# Patient Record
Sex: Female | Born: 1995 | Race: Black or African American | Hispanic: No | Marital: Single | Smoking: Never smoker
Health system: Southern US, Community
[De-identification: ages and names within clinical notes are randomized; demographics above are authoritative.]

## PROBLEM LIST (undated history)

## (undated) DIAGNOSIS — K219 Gastro-esophageal reflux disease without esophagitis: Secondary | ICD-10-CM

## (undated) DIAGNOSIS — N62 Hypertrophy of breast: Secondary | ICD-10-CM

## (undated) DIAGNOSIS — G4733 Obstructive sleep apnea (adult) (pediatric): Secondary | ICD-10-CM

## (undated) DIAGNOSIS — G43909 Migraine, unspecified, not intractable, without status migrainosus: Secondary | ICD-10-CM

## (undated) HISTORY — DX: Morbid (severe) obesity due to excess calories: E66.01

## (undated) HISTORY — PX: UPPER GASTROINTESTINAL ENDOSCOPY: SHX188

## (undated) HISTORY — DX: Obstructive sleep apnea (adult) (pediatric): G47.33

---

## 2017-11-19 ENCOUNTER — Other Ambulatory Visit: Payer: Self-pay | Admitting: Student

## 2017-11-19 DIAGNOSIS — Z8719 Personal history of other diseases of the digestive system: Secondary | ICD-10-CM

## 2017-11-19 DIAGNOSIS — R1011 Right upper quadrant pain: Secondary | ICD-10-CM

## 2017-11-19 DIAGNOSIS — R1013 Epigastric pain: Secondary | ICD-10-CM

## 2017-11-23 ENCOUNTER — Other Ambulatory Visit: Payer: Self-pay

## 2017-11-27 ENCOUNTER — Other Ambulatory Visit: Payer: Self-pay

## 2017-12-26 ENCOUNTER — Other Ambulatory Visit: Payer: Self-pay

## 2017-12-26 ENCOUNTER — Encounter (HOSPITAL_BASED_OUTPATIENT_CLINIC_OR_DEPARTMENT_OTHER): Payer: Self-pay | Admitting: Emergency Medicine

## 2017-12-26 ENCOUNTER — Emergency Department (HOSPITAL_BASED_OUTPATIENT_CLINIC_OR_DEPARTMENT_OTHER)
Admission: EM | Admit: 2017-12-26 | Discharge: 2017-12-26 | Disposition: A | Discharge status: Home / Self Care | Payer: 59 | Attending: Emergency Medicine | Admitting: Emergency Medicine

## 2017-12-26 DIAGNOSIS — J111 Influenza due to unidentified influenza virus with other respiratory manifestations: Secondary | ICD-10-CM | POA: Insufficient documentation

## 2017-12-26 DIAGNOSIS — R509 Fever, unspecified: Secondary | ICD-10-CM | POA: Diagnosis present

## 2017-12-26 DIAGNOSIS — R69 Illness, unspecified: Secondary | ICD-10-CM

## 2017-12-26 MED ORDER — ACETAMINOPHEN 325 MG PO TABS
650.0000 mg | ORAL_TABLET | Freq: Once | ORAL | Status: AC | PRN
  Administered 2017-12-26: 650 mg via ORAL
  Filled 2017-12-26: qty 2

## 2017-12-26 NOTE — Discharge Instructions (Signed)
Take Tylenol every 4 hours for aches or for temperature higher than 100.4.Make sure that you drink at least six 8 ounce glasses of water or Gatorade each day in order to stay well-hydrated.  Call the number on these instructions to get a primary care physician or see an urgent care center or the doctor of your choice if not feeling better in a week.  Return if concern for any reason

## 2017-12-26 NOTE — ED Provider Notes (Signed)
[facility] EMERGENCY DEPARTMENT Provider Note   CSN: [id number] Arrival date & time: 12/26/17  1143     History   Chief Complaint Chief Complaint  Patient presents with  . Flu like symptoms    HPI Kelsey Rojas is a 22 y.o. female.  Complains of headache sore throat cough fever and diffuse myalgias onset 3 days ago.  Denies any shortness of breath.  No nausea or vomiting.  No other associated symptoms.  Treated with naproxen, without relief.  Nothing makes symptoms better or worse.  No other associated symptoms  HPI  History reviewed. No pertinent past medical history.  There are no active problems to display for this patient.   History reviewed. No pertinent surgical history.  OB History    No data available       Home Medications    Prior to Admission medications   Not on File    Family History No family history on file.  Social History Social History   Tobacco Use  . Smoking status: Never Smoker  . Smokeless tobacco: Never Used  Substance Use Topics  . Alcohol use: Yes    Frequency: Never  . Drug use: No     Allergies   Amoxicillin and Penicillins   Review of Systems Review of Systems  Constitutional: Positive for fever.  HENT: Positive for sore throat.   Respiratory: Positive for cough.   Cardiovascular: Negative.   Gastrointestinal: Negative.   Musculoskeletal: Positive for myalgias.  Skin: Negative.   Neurological: Positive for headaches.  Psychiatric/Behavioral: Negative.   All other systems reviewed and are negative.    Physical Exam Updated Vital Signs BP 139/79 (BP Location: Left Arm)   Pulse (!) 103   Temp (!) 100.9 F (38.3 C) (Oral)   Resp 18   Ht 5\' 2"  (1.575 m)   Wt 119.7 kg (264 lb)   LMP 12/18/2017   SpO2 95%   BMI 48.29 kg/m   Physical Exam  Constitutional: She is oriented to person, place, and time. She appears well-developed and well-nourished. No distress.  HENT:  Head: Normocephalic and  atraumatic.  Right Ear: External ear normal.  Left Ear: External ear normal.  Mouth/Throat: Oropharynx is clear and moist.  Bilateral tympanic membranes normal  Eyes: Conjunctivae are normal. Pupils are equal, round, and reactive to light.  Neck: Neck supple. No tracheal deviation present. No thyromegaly present.  Cardiovascular: Normal rate and regular rhythm.  No murmur heard. Pulmonary/Chest: Effort normal and breath sounds normal.  Abdominal: Soft. Bowel sounds are normal. She exhibits no distension. There is no tenderness.  Obese  Musculoskeletal: Normal range of motion. She exhibits no edema or tenderness.  Lymphadenopathy:    She has no cervical adenopathy.  Neurological: She is alert and oriented to person, place, and time. Coordination normal.  Skin: Skin is warm and dry. Capillary refill takes less than 2 seconds. No rash noted.  Psychiatric: She has a normal mood and affect.  Nursing note and vitals reviewed.    ED Treatments / Results  Labs (all labs ordered are listed, but only abnormal results are displayed) Labs Reviewed - No data to display  EKG  EKG Interpretation None       Radiology No results found.  Procedures Procedures (including critical care time)  Medications Ordered in ED Medications  acetaminophen (TYLENOL) tablet 650 mg (650 mg Oral Given 12/26/17 1211)     Initial Impression / Assessment and Plan / ED Course  I have reviewed  the triage vital signs and the nursing notes.  Pertinent labs & imaging results that were available during my care of the patient were reviewed by me and considered in my medical decision making (see chart for details).     1 PM feels improved after treatment with Tylenol No further diagnostic workup needed.  Plan Tylenol.  Encourage oral hydration.  Referral primary care. Final Clinical Impressions(s) / ED Diagnoses  Diagnosis influenza-like illness Final diagnoses:  None    ED Discharge Orders    None         Doug SouJacubowitz, Bekah Igoe, MD 12/26/17 1306

## 2017-12-26 NOTE — ED Triage Notes (Signed)
Pt with cough, headache, body aches and subjective fever x 3 days.

## 2018-04-18 ENCOUNTER — Emergency Department (HOSPITAL_BASED_OUTPATIENT_CLINIC_OR_DEPARTMENT_OTHER)
Admission: EM | Admit: 2018-04-18 | Discharge: 2018-04-18 | Disposition: A | Discharge status: Home / Self Care | Payer: 59 | Attending: Emergency Medicine | Admitting: Emergency Medicine

## 2018-04-18 ENCOUNTER — Encounter (HOSPITAL_BASED_OUTPATIENT_CLINIC_OR_DEPARTMENT_OTHER): Payer: Self-pay | Admitting: Emergency Medicine

## 2018-04-18 ENCOUNTER — Other Ambulatory Visit: Payer: Self-pay

## 2018-04-18 DIAGNOSIS — N898 Other specified noninflammatory disorders of vagina: Secondary | ICD-10-CM | POA: Insufficient documentation

## 2018-04-18 DIAGNOSIS — N949 Unspecified condition associated with female genital organs and menstrual cycle: Secondary | ICD-10-CM

## 2018-04-18 LAB — WET PREP, GENITAL
CLUE CELLS WET PREP: NONE SEEN
SPERM: NONE SEEN
Trich, Wet Prep: NONE SEEN
Yeast Wet Prep HPF POC: NONE SEEN

## 2018-04-18 LAB — URINALYSIS, ROUTINE W REFLEX MICROSCOPIC
BILIRUBIN URINE: NEGATIVE
Glucose, UA: NEGATIVE mg/dL
Hgb urine dipstick: NEGATIVE
KETONES UR: NEGATIVE mg/dL
Leukocytes, UA: NEGATIVE
NITRITE: NEGATIVE
Protein, ur: NEGATIVE mg/dL
Specific Gravity, Urine: 1.02 (ref 1.005–1.030)
pH: 7.5 (ref 5.0–8.0)

## 2018-04-18 LAB — PREGNANCY, URINE: Preg Test, Ur: NEGATIVE

## 2018-04-18 NOTE — ED Triage Notes (Signed)
Pt states it "feels like I have air pockets in my vagina". Denies discharge.

## 2018-04-18 NOTE — Discharge Instructions (Signed)
Today you were seen and evaluated for abnormal vaginal sensation.  Today you have been tested for gonorrhea and chlamydia.  The test to determine if you have these will take a few days. They will only call you if your tests come back positive, no news is good news. In the result that your tests are positive you will need treatment.  Please do not have physical relations for the next 1-2 weeks.

## 2018-04-18 NOTE — ED Provider Notes (Signed)
[facility] EMERGENCY DEPARTMENT Provider Note   CSN: [id number] Arrival date & time: 04/18/18  1856     History   Chief Complaint Chief Complaint  Patient presents with  . vaginal problem    HPI Malachi Prolyiah Rhines is a 22 y.o. female who presents today for evaluation of feeling like she has air pockets in her vagina.  She reports that this is been present for approximately 1 week, and is unchanged.  She reports that she is sexually active, however has not had intercourse in the past 3 weeks.  She denies any abnormal vaginal drainage or discharge.  No vaginal odors.  Reports that she has never had anything like this before.  She denies pain, only stating that it feels like she has a bubble near the opening of her vagina that will not come out.  She denies air escaping from her vagina.    HPI  History reviewed. No pertinent past medical history.  There are no active problems to display for this patient.   History reviewed. No pertinent surgical history.   OB History   None      Home Medications    Prior to Admission medications   Not on File    Family History No family history on file.  Social History Social History   Tobacco Use  . Smoking status: Never Smoker  . Smokeless tobacco: Never Used  Substance Use Topics  . Alcohol use: Yes    Frequency: Never  . Drug use: No     Allergies   Amoxicillin and Penicillins   Review of Systems Review of Systems  Constitutional: Negative for chills and fever.  HENT: Negative for ear pain and sore throat.   Eyes: Negative for pain and visual disturbance.  Respiratory: Negative for cough and shortness of breath.   Cardiovascular: Negative for chest pain and palpitations.  Gastrointestinal: Negative for abdominal pain and vomiting.  Genitourinary: Negative for dysuria, hematuria, vaginal bleeding, vaginal discharge and vaginal pain.  Musculoskeletal: Negative for arthralgias and back pain.  Skin: Negative for  color change and rash.  Neurological: Negative for seizures and syncope.  All other systems reviewed and are negative.    Physical Exam Updated Vital Signs BP 132/74 (BP Location: Right Arm)   Pulse 80   Temp 98.1 F (36.7 C) (Oral)   Resp 14   Ht 5\' 2"  (1.575 m)   Wt 126.6 kg (279 lb)   LMP 02/26/2018   SpO2 100%   BMI 51.03 kg/m   Physical Exam  Constitutional: She appears well-developed and well-nourished. No distress.  HENT:  Head: Normocephalic and atraumatic.  Eyes: Conjunctivae are normal. Right eye exhibits no discharge. Left eye exhibits no discharge. No scleral icterus.  Neck: Normal range of motion.  Cardiovascular: Normal rate and regular rhythm.  Pulmonary/Chest: Effort normal. No stridor. No respiratory distress.  Abdominal: She exhibits no distension.  Genitourinary:  Genitourinary Comments: Exam performed with chaperone in room.  Normal external female genitalia.  Vaginal canal without obvious foreign body, no drainage.  Bimanual exam without masses or focal tenderness.  No abnormal air noted.  Musculoskeletal: She exhibits no edema or deformity.  Neurological: She is alert. She exhibits normal muscle tone.  Skin: Skin is warm and dry. She is not diaphoretic.  Psychiatric: She has a normal mood and affect. Her behavior is normal.  Nursing note and vitals reviewed.    ED Treatments / Results  Labs (all labs ordered are listed, but only abnormal  results are displayed) Labs Reviewed  WET PREP, GENITAL - Abnormal; Notable for the following components:      Result Value   WBC, Wet Prep HPF POC FEW (*)    All other components within normal limits  URINALYSIS, ROUTINE W REFLEX MICROSCOPIC  PREGNANCY, URINE  GC/CHLAMYDIA PROBE AMP ([place]) NOT AT [facility]    EKG None  Radiology No results found.  Procedures Procedures (including critical care time)  Medications Ordered in ED Medications - No data to display   Initial Impression / Assessment  and Plan / ED Course  I have reviewed the triage vital signs and the nursing notes.  Pertinent labs & imaging results that were available during my care of the patient were reviewed by me and considered in my medical decision making (see chart for details).    Patient is a 22 year old woman who presents today for evaluation of feeling like she has air bubbles trapped in her vagina.  Discussed evaluation options with patient who elected for pelvic exam.  Pelvic exam unremarkable, no adnexal tenderness or fullness, no cervical motion tenderness.  Wet prep with occasional white blood cells.  Gonorrhea chlamydia swabs sent.  No obvious cause for patient's symptoms found.  Patient was not pregnant, urine was unremarkable.  Primary care follow-up recommended.  Patient given information for women's outpatient hospital clinic.  Patient discharged home.  Final Clinical Impressions(s) / ED Diagnoses   Final diagnoses:  Vaginal discomfort    ED Discharge Orders    None       Cristina Gong, PA-C 04/19/18 0255    Rolan Bucco, MD 04/26/18 1745

## 2018-04-18 NOTE — ED Notes (Signed)
Pt discharged to home NAD.  

## 2018-04-20 LAB — GC/CHLAMYDIA PROBE AMP (~~LOC~~) NOT AT ARMC
Chlamydia: POSITIVE — AB
Neisseria Gonorrhea: NEGATIVE

## 2018-04-21 ENCOUNTER — Telehealth: Payer: Self-pay | Admitting: Student

## 2018-04-21 DIAGNOSIS — A749 Chlamydial infection, unspecified: Secondary | ICD-10-CM

## 2018-04-21 MED ORDER — AZITHROMYCIN 500 MG PO TABS: 1000.0000 mg | ORAL_TABLET | Freq: Once | ORAL | 0 refills | Status: AC

## 2018-04-21 NOTE — Telephone Encounter (Addendum)
Celester Coate tested positive for  Chlamydia. Patient was called by RN and allergies and pharmacy confirmed. Rx sent to pharmacy of choice.   Judeth HornLawrence, Danie Hannig, NP 04/21/2018 2:43 PM       ----- Message from Kathe BectonLori S Berdik, RN sent at 04/21/2018  2:08 PM EDT ----- This patient tested positive for :  chlamydia  She ,"is allergic to Penicillins and Amoxicillin",  I have informed the patient of her results and confirmed her pharmacy is correct in her chart. Please send Rx.   Thank you,   Kathe BectonBerdik, Lori S, RN   [facility]

## 2018-09-15 ENCOUNTER — Ambulatory Visit: Payer: Self-pay | Admitting: Plastic Surgery

## 2018-09-17 DIAGNOSIS — N62 Hypertrophy of breast: Secondary | ICD-10-CM

## 2018-09-17 HISTORY — DX: Hypertrophy of breast: N62

## 2018-10-04 ENCOUNTER — Encounter (HOSPITAL_BASED_OUTPATIENT_CLINIC_OR_DEPARTMENT_OTHER): Payer: Self-pay | Admitting: *Deleted

## 2018-10-04 NOTE — Pre-Procedure Instructions (Signed)
Discussed BMI of 50.3 with Dr. Rondall AllegraS. Turk - pt. will be better served having surgery [facility].  Elizabeth at Dr. Sherald Hessontogiannis' office notified.

## 2018-10-05 NOTE — Pre-Procedure Instructions (Signed)
[facility].  Instructed to wait for notification from Dr. Sherald Hessontogiannis' office.

## 2018-10-08 ENCOUNTER — Ambulatory Visit: Payer: Self-pay | Admitting: Plastic Surgery

## 2018-10-11 ENCOUNTER — Encounter (HOSPITAL_COMMUNITY): Payer: Self-pay | Admitting: *Deleted

## 2018-10-11 ENCOUNTER — Other Ambulatory Visit: Payer: Self-pay

## 2018-10-11 NOTE — Progress Notes (Signed)
Spoke with pt for pre-op call. Pt denies cardiac history, diabetes or HTN.

## 2018-10-11 NOTE — Anesthesia Preprocedure Evaluation (Addendum)
Anesthesia Evaluation  Patient identified by MRN, date of birth, ID band Patient awake    Reviewed: Allergy & Precautions, NPO status , Patient's Chart, lab work & pertinent test results  Airway Mallampati: I  TM Distance: >3 FB Neck ROM: Full    Dental  (+) Teeth Intact, Dental Advisory Given   Pulmonary neg pulmonary ROS,    Pulmonary exam normal breath sounds clear to auscultation       Cardiovascular negative cardio ROS Normal cardiovascular exam Rhythm:Regular Rate:Normal     Neuro/Psych  Headaches, negative psych ROS   GI/Hepatic Neg liver ROS, GERD  Medicated and Controlled,  Endo/Other  Morbid obesityBilateral macromastia  Renal/GU negative Renal ROS  negative genitourinary   Musculoskeletal negative musculoskeletal ROS (+)   Abdominal (+) + obese,   Peds  Hematology negative hematology ROS (+)   Anesthesia Other Findings   Reproductive/Obstetrics                           Anesthesia Physical Anesthesia Plan  ASA: III  Anesthesia Plan: General   Post-op Pain Management:    Induction: Intravenous  PONV Risk Score and Plan: 4 or greater and Scopolamine patch - Pre-op, Ondansetron, Dexamethasone, Midazolam and Treatment may vary due to age or medical condition  Airway Management Planned: Oral ETT  Additional Equipment: None  Intra-op Plan:   Post-operative Plan: Extubation in OR  Informed Consent: I have reviewed the patients History and Physical, chart, labs and discussed the procedure including the risks, benefits and alternatives for the proposed anesthesia with the patient or authorized representative who has indicated his/her understanding and acceptance.   Dental advisory given  Plan Discussed with: CRNA, Anesthesiologist and Surgeon  Anesthesia Plan Comments:        Anesthesia Quick Evaluation

## 2018-10-12 ENCOUNTER — Encounter (HOSPITAL_COMMUNITY)
Admission: RE | Disposition: A | Discharge status: Home / Self Care | Payer: Self-pay | Source: Ambulatory Visit | Attending: Plastic Surgery

## 2018-10-12 ENCOUNTER — Ambulatory Visit (HOSPITAL_COMMUNITY)
Admission: RE | Admit: 2018-10-12 | Discharge: 2018-10-12 | Disposition: A | Discharge status: Home / Self Care | Payer: 59 | Source: Ambulatory Visit | Attending: Plastic Surgery | Admitting: Plastic Surgery

## 2018-10-12 ENCOUNTER — Ambulatory Visit (HOSPITAL_COMMUNITY): Payer: 59 | Admitting: Certified Registered"

## 2018-10-12 ENCOUNTER — Encounter (HOSPITAL_COMMUNITY): Payer: Self-pay | Admitting: *Deleted

## 2018-10-12 DIAGNOSIS — N62 Hypertrophy of breast: Secondary | ICD-10-CM | POA: Insufficient documentation

## 2018-10-12 DIAGNOSIS — K219 Gastro-esophageal reflux disease without esophagitis: Secondary | ICD-10-CM | POA: Diagnosis not present

## 2018-10-12 DIAGNOSIS — Z79899 Other long term (current) drug therapy: Secondary | ICD-10-CM | POA: Diagnosis not present

## 2018-10-12 HISTORY — DX: Hypertrophy of breast: N62

## 2018-10-12 HISTORY — PX: BREAST REDUCTION SURGERY: SHX8

## 2018-10-12 HISTORY — DX: Migraine, unspecified, not intractable, without status migrainosus: G43.909

## 2018-10-12 HISTORY — DX: Gastro-esophageal reflux disease without esophagitis: K21.9

## 2018-10-12 LAB — CBC
HEMATOCRIT: 42.4 % (ref 36.0–46.0)
HEMOGLOBIN: 12.5 g/dL (ref 12.0–15.0)
MCH: 26.8 pg (ref 26.0–34.0)
MCHC: 29.5 g/dL — ABNORMAL LOW (ref 30.0–36.0)
MCV: 91 fL (ref 80.0–100.0)
NRBC: 0 % (ref 0.0–0.2)
Platelets: 224 10*3/uL (ref 150–400)
RBC: 4.66 MIL/uL (ref 3.87–5.11)
RDW: 12.7 % (ref 11.5–15.5)
WBC: 5.8 10*3/uL (ref 4.0–10.5)

## 2018-10-12 LAB — POCT PREGNANCY, URINE: Preg Test, Ur: NEGATIVE

## 2018-10-12 SURGERY — MAMMOPLASTY, REDUCTION
Anesthesia: General | Site: Breast | Laterality: Bilateral

## 2018-10-12 MED ORDER — CHLORHEXIDINE GLUCONATE CLOTH 2 % EX PADS: 6.0000 | MEDICATED_PAD | Freq: Once | CUTANEOUS | Status: DC

## 2018-10-12 MED ORDER — BUPIVACAINE-EPINEPHRINE (PF) 0.25% -1:200000 IJ SOLN
INTRAMUSCULAR | Status: AC
  Filled 2018-10-12: qty 30

## 2018-10-12 MED ORDER — CEFAZOLIN SODIUM 1 G IJ SOLR
INTRAMUSCULAR | Status: AC
  Filled 2018-10-12: qty 30

## 2018-10-12 MED ORDER — DEXAMETHASONE SODIUM PHOSPHATE 10 MG/ML IJ SOLN
INTRAMUSCULAR | Status: DC | PRN
  Administered 2018-10-12: 10 mg via INTRAVENOUS

## 2018-10-12 MED ORDER — HYDROCODONE-ACETAMINOPHEN 7.5-325 MG PO TABS
1.0000 | ORAL_TABLET | Freq: Once | ORAL | Status: AC | PRN
  Administered 2018-10-12: 1 via ORAL

## 2018-10-12 MED ORDER — ALBUMIN HUMAN 5 % IV SOLN
INTRAVENOUS | Status: DC | PRN
  Administered 2018-10-12: 09:00:00 via INTRAVENOUS

## 2018-10-12 MED ORDER — PROPOFOL 10 MG/ML IV BOLUS
INTRAVENOUS | Status: AC
  Filled 2018-10-12: qty 20

## 2018-10-12 MED ORDER — BACITRACIN ZINC 500 UNIT/GM EX OINT
TOPICAL_OINTMENT | CUTANEOUS | Status: AC
  Filled 2018-10-12: qty 28.35

## 2018-10-12 MED ORDER — MIDAZOLAM HCL 2 MG/2ML IJ SOLN
INTRAMUSCULAR | Status: AC
  Filled 2018-10-12: qty 2

## 2018-10-12 MED ORDER — METOCLOPRAMIDE HCL 5 MG/ML IJ SOLN: 10.0000 mg | Freq: Once | INTRAMUSCULAR | Status: DC | PRN

## 2018-10-12 MED ORDER — LIDOCAINE-EPINEPHRINE 1 %-1:100000 IJ SOLN
INTRAMUSCULAR | Status: AC
  Filled 2018-10-12: qty 3

## 2018-10-12 MED ORDER — SCOPOLAMINE 1 MG/3DAYS TD PT72
MEDICATED_PATCH | TRANSDERMAL | Status: AC
  Filled 2018-10-12: qty 2

## 2018-10-12 MED ORDER — DEXAMETHASONE SODIUM PHOSPHATE 10 MG/ML IJ SOLN
INTRAMUSCULAR | Status: AC
  Filled 2018-10-12: qty 1

## 2018-10-12 MED ORDER — SCOPOLAMINE 1 MG/3DAYS TD PT72
MEDICATED_PATCH | TRANSDERMAL | Status: DC | PRN
  Administered 2018-10-12: 1 via TRANSDERMAL

## 2018-10-12 MED ORDER — SODIUM CHLORIDE (PF) 0.9 % IJ SOLN
INTRAMUSCULAR | Status: AC
  Filled 2018-10-12: qty 10

## 2018-10-12 MED ORDER — 0.9 % SODIUM CHLORIDE (POUR BTL) OPTIME
TOPICAL | Status: DC | PRN
  Administered 2018-10-12 (×3): 1000 mL

## 2018-10-12 MED ORDER — LACTATED RINGERS IV SOLN
INTRAVENOUS | Status: DC | PRN
  Administered 2018-10-12 (×3): via INTRAVENOUS

## 2018-10-12 MED ORDER — SUFENTANIL CITRATE 50 MCG/ML IV SOLN
INTRAVENOUS | Status: AC
  Filled 2018-10-12: qty 1

## 2018-10-12 MED ORDER — SODIUM CHLORIDE (PF) 0.9 % IJ SOLN
INTRAMUSCULAR | Status: DC | PRN
  Administered 2018-10-12: 40 mL
  Administered 2018-10-12: 80 mL

## 2018-10-12 MED ORDER — ONDANSETRON HCL 4 MG/2ML IJ SOLN
INTRAMUSCULAR | Status: AC
  Filled 2018-10-12: qty 2

## 2018-10-12 MED ORDER — PROPOFOL 10 MG/ML IV BOLUS
INTRAVENOUS | Status: DC | PRN
  Administered 2018-10-12: 200 mg via INTRAVENOUS

## 2018-10-12 MED ORDER — BUPIVACAINE-EPINEPHRINE (PF) 0.25% -1:200000 IJ SOLN
INTRAMUSCULAR | Status: DC | PRN
  Administered 2018-10-12: 60 mL

## 2018-10-12 MED ORDER — HYDROMORPHONE HCL 1 MG/ML IJ SOLN
0.2500 mg | INTRAMUSCULAR | Status: DC | PRN
  Administered 2018-10-12: 0.5 mg via INTRAVENOUS

## 2018-10-12 MED ORDER — ROCURONIUM BROMIDE 50 MG/5ML IV SOSY
PREFILLED_SYRINGE | INTRAVENOUS | Status: AC
  Filled 2018-10-12: qty 5

## 2018-10-12 MED ORDER — MEPERIDINE HCL 50 MG/ML IJ SOLN: 6.2500 mg | INTRAMUSCULAR | Status: DC | PRN

## 2018-10-12 MED ORDER — DEXTROSE 5 % IV SOLN
3.0000 g | INTRAVENOUS | Status: AC
  Administered 2018-10-12 (×2): 3 g via INTRAVENOUS
  Filled 2018-10-12: qty 3

## 2018-10-12 MED ORDER — ONDANSETRON HCL 4 MG/2ML IJ SOLN
INTRAMUSCULAR | Status: DC | PRN
  Administered 2018-10-12: 4 mg via INTRAVENOUS

## 2018-10-12 MED ORDER — LIDOCAINE 2% (20 MG/ML) 5 ML SYRINGE
INTRAMUSCULAR | Status: DC | PRN
  Administered 2018-10-12: 100 mg via INTRAVENOUS
  Administered 2018-10-12: 50 mg via INTRAVENOUS

## 2018-10-12 MED ORDER — HYDROMORPHONE HCL 1 MG/ML IJ SOLN
INTRAMUSCULAR | Status: AC
  Filled 2018-10-12: qty 1

## 2018-10-12 MED ORDER — CEFAZOLIN SODIUM-DEXTROSE 2-4 GM/100ML-% IV SOLN: 2.0000 g | INTRAVENOUS | Status: DC

## 2018-10-12 MED ORDER — BUPIVACAINE-EPINEPHRINE (PF) 0.25% -1:200000 IJ SOLN
INTRAMUSCULAR | Status: AC
  Filled 2018-10-12: qty 60

## 2018-10-12 MED ORDER — LIDOCAINE-EPINEPHRINE 1 %-1:100000 IJ SOLN
INTRAMUSCULAR | Status: DC | PRN
  Administered 2018-10-12: 40 mL

## 2018-10-12 MED ORDER — ROCURONIUM BROMIDE 10 MG/ML (PF) SYRINGE
PREFILLED_SYRINGE | INTRAVENOUS | Status: DC | PRN
  Administered 2018-10-12: 50 mg via INTRAVENOUS

## 2018-10-12 MED ORDER — HYDROCODONE-ACETAMINOPHEN 7.5-325 MG PO TABS
ORAL_TABLET | ORAL | Status: AC
  Filled 2018-10-12: qty 1

## 2018-10-12 MED ORDER — MIDAZOLAM HCL 5 MG/5ML IJ SOLN
INTRAMUSCULAR | Status: DC | PRN
  Administered 2018-10-12: 2 mg via INTRAVENOUS

## 2018-10-12 MED ORDER — BUPIVACAINE LIPOSOME 1.3 % IJ SUSP
20.0000 mL | INTRAMUSCULAR | Status: AC
  Administered 2018-10-12: 20 mL
  Filled 2018-10-12: qty 20

## 2018-10-12 MED ORDER — LIDOCAINE 2% (20 MG/ML) 5 ML SYRINGE
INTRAMUSCULAR | Status: AC
  Filled 2018-10-12: qty 5

## 2018-10-12 MED ORDER — SUFENTANIL CITRATE 50 MCG/ML IV SOLN
INTRAVENOUS | Status: DC | PRN
  Administered 2018-10-12: 5 ug via INTRAVENOUS
  Administered 2018-10-12 (×4): 10 ug via INTRAVENOUS
  Administered 2018-10-12: 20 ug via INTRAVENOUS
  Administered 2018-10-12: 5 ug via INTRAVENOUS

## 2018-10-12 SURGICAL SUPPLY — 62 items
ALCOHOL ISOPROPYL (RUBBING) (MISCELLANEOUS) ×1 IMPLANT
ATCH SMKEVC FLXB CAUT HNDSWH (FILTER) ×1 IMPLANT
BLADE KNIFE  10 PERSONNA (BLADE) ×5 IMPLANT
BLADE KNIFE PERSONA 15 (BLADE) ×4 IMPLANT
BLADE SURG 10 STRL SS (BLADE) ×1 IMPLANT
BLADE SURG 15 STRL LF DISP TIS (BLADE) IMPLANT
BLADE SURG 15 STRL SS (BLADE) ×2
BNDG GAUZE ELAST 4 BULKY (GAUZE/BANDAGES/DRESSINGS) ×4 IMPLANT
CANISTER SUCT 3000ML PPV (MISCELLANEOUS) ×2 IMPLANT
CONT SPEC 4OZ CLIKSEAL STRL BL (MISCELLANEOUS) ×1 IMPLANT
COVER WAND RF STERILE (DRAPES) ×2 IMPLANT
DRAIN CHANNEL 10F 3/8 F FF (DRAIN) ×5 IMPLANT
DRAPE HALF SHEET 40X57 (DRAPES) ×4 IMPLANT
DRAPE ORTHO SPLIT 77X108 STRL (DRAPES) ×2
DRAPE SURG ORHT 6 SPLT 77X108 (DRAPES) ×2 IMPLANT
DRSG EMULSION OIL 3X3 NADH (GAUZE/BANDAGES/DRESSINGS) ×2 IMPLANT
DRSG PAD ABDOMINAL 8X10 ST (GAUZE/BANDAGES/DRESSINGS) ×4 IMPLANT
DRSG TELFA 3X8 NADH (GAUZE/BANDAGES/DRESSINGS) IMPLANT
ELECT CAUTERY BLADE 6.4 (BLADE) ×2 IMPLANT
ELECT REM PT RETURN 9FT ADLT (ELECTROSURGICAL) ×2
ELECTRODE REM PT RTRN 9FT ADLT (ELECTROSURGICAL) ×1 IMPLANT
EVACUATOR SILICONE 100CC (DRAIN) ×5 IMPLANT
EVACUATOR SMOKE ACCUVAC VALLEY (FILTER) ×1
GAUZE SPONGE 4X4 12PLY STRL (GAUZE/BANDAGES/DRESSINGS) ×2 IMPLANT
GLOVE BIO SURGEON STRL SZ 6.5 (GLOVE) ×4 IMPLANT
GLOVE BIO SURGEON STRL SZ7 (GLOVE) ×1 IMPLANT
GLOVE BIO SURGEON STRL SZ7.5 (GLOVE) ×3 IMPLANT
GLOVE BIOGEL PI IND STRL 7.5 (GLOVE) IMPLANT
GLOVE BIOGEL PI INDICATOR 7.5 (GLOVE) ×2
GOWN STRL REUS W/ TWL LRG LVL3 (GOWN DISPOSABLE) ×2 IMPLANT
GOWN STRL REUS W/TWL LRG LVL3 (GOWN DISPOSABLE) ×2
KIT BASIN OR (CUSTOM PROCEDURE TRAY) ×2 IMPLANT
KIT TURNOVER KIT B (KITS) ×2 IMPLANT
MARKER SKIN DUAL TIP RULER LAB (MISCELLANEOUS) ×2 IMPLANT
NDL HYPO 25GX1X1/2 BEV (NEEDLE) ×1 IMPLANT
NDL SPNL 18GX3.5 QUINCKE PK (NEEDLE) ×1 IMPLANT
NEEDLE HYPO 25GX1X1/2 BEV (NEEDLE) ×2 IMPLANT
NEEDLE SPNL 18GX3.5 QUINCKE PK (NEEDLE) ×2 IMPLANT
PACK GENERAL/GYN (CUSTOM PROCEDURE TRAY) ×2 IMPLANT
PAD ARMBOARD 7.5X6 YLW CONV (MISCELLANEOUS) ×4 IMPLANT
PAD DRESSING TELFA 3X8 NADH (GAUZE/BANDAGES/DRESSINGS) IMPLANT
PAD SHARPS MAGNETIC DISPOSAL (MISCELLANEOUS) ×2 IMPLANT
PIN SAFETY STERILE (MISCELLANEOUS) ×2 IMPLANT
PREFILTER EVAC NS 1 1/3-3/8IN (MISCELLANEOUS) ×2 IMPLANT
PREFILTER SMOKE EVAC (FILTER) ×2 IMPLANT
SPECIMEN JAR LARGE (MISCELLANEOUS) ×2 IMPLANT
SPONGE LAP 18X18 RF (DISPOSABLE) ×1 IMPLANT
SPONGE LAP 18X18 X RAY DECT (DISPOSABLE) IMPLANT
STAPLER VISISTAT 35W (STAPLE) ×4 IMPLANT
STRIP CLOSURE SKIN 1/2X4 (GAUZE/BANDAGES/DRESSINGS) ×4 IMPLANT
SUT ETHILON 3 0 PS 1 (SUTURE) ×2 IMPLANT
SUT MNCRL AB 3-0 PS2 18 (SUTURE) ×10 IMPLANT
SUT MNCRL AB 4-0 PS2 18 (SUTURE) ×8 IMPLANT
SUT MON AB 5-0 PS2 18 (SUTURE) ×6 IMPLANT
SUT PROLENE 2 0 CT2 30 (SUTURE) ×2 IMPLANT
SUT PROLENE 3 0 PS 1 (SUTURE) ×2 IMPLANT
SUT VLOC 180 0 24IN GS25 (SUTURE) ×1 IMPLANT
SUT VLOC 90 P-14 23 (SUTURE) ×3 IMPLANT
SYR BULB IRRIGATION 50ML (SYRINGE) ×2 IMPLANT
SYR CONTROL 10ML LL (SYRINGE) ×4 IMPLANT
TRAY FOLEY CATH SILVER 16FR (SET/KITS/TRAYS/PACK) ×2 IMPLANT
TUBE CONNECTING 12X1/4 (SUCTIONS) ×4 IMPLANT

## 2018-10-12 NOTE — H&P (Signed)
  H&P faxed to surgical center.  -History and Physical Reviewed  -Patient has been re-examined  -No change in the plan of care  CONTOGIANNIS,MARY A    

## 2018-10-12 NOTE — Brief Op Note (Signed)
10/12/2018  12:55 PM  PATIENT:  Kelsey Rojas  22 y.o. female  PRE-OPERATIVE DIAGNOSIS:  BILATERAL MACROMASTIA  POST-OPERATIVE DIAGNOSIS:  BILATERAL MACROMASTIA  PROCEDURE:  Procedure(s) with comments: BILATERAL MAMMARY REDUCTION  (BREAST) (Bilateral) - BILATERAL MAMMARY REDUCTION  (BREAST)  SURGEON:  Surgeon(s) and Role:    * Contogiannis, Chales AbrahamsMary Ann, MD - Primary  ANESTHESIA:   general  EBL:  125 mL   BLOOD ADMINISTERED:none  DRAINS: (62F) Jackson-Pratt drain(s) with closed bulb suction in the Bilateral Breasts   LOCAL MEDICATIONS USED:  1.3% Exparel (total 266 mgs.)  SPECIMEN:  Source of Specimen:  Bilateral Breasts  DISPOSITION OF SPECIMEN:  PATHOLOGY  COUNTS:  YES  DICTATION: .Note written in EPIC  PLAN OF CARE: Discharge to home after PACU  PATIENT DISPOSITION:  PACU - hemodynamically stable.   Delay start of Pharmacological VTE agent (>24hrs) due to surgical blood loss or risk of bleeding: not applicable

## 2018-10-12 NOTE — Discharge Instructions (Signed)
1. No lifting greater than 5 lbs with arms for 4 weeks. 2. Empty, strip, record and reactivate JP drains 3 times a day. 3. Percocet 5/325 mg tabs 1-2 tabs po q 4-6 hours prn pain- prescription given in office. 4. Duricef 1 tab po bid- prescription given in office. 5. Sterapred dose pack as directed- prescription given in office. 6. Follow-up appointment Friday in office.   

## 2018-10-12 NOTE — Anesthesia Postprocedure Evaluation (Signed)
Anesthesia Post Note  Patient: Kelsey Rojas  Procedure(s) Performed: BILATERAL MAMMARY REDUCTION  (BREAST) (Bilateral Breast)     Patient location during evaluation: PACU Anesthesia Type: General Level of consciousness: awake and alert and oriented Pain management: pain level controlled Vital Signs Assessment: post-procedure vital signs reviewed and stable Respiratory status: spontaneous breathing, nonlabored ventilation and respiratory function stable Cardiovascular status: blood pressure returned to baseline and stable Postop Assessment: no apparent nausea or vomiting Anesthetic complications: no    Last Vitals:  Vitals:   10/12/18 1330 10/12/18 1345  BP: (!) 148/90 (!) 148/98  Pulse: (!) 108 85  Resp: 20 15  Temp:    SpO2: 100% 100%    Last Pain:  Vitals:   10/12/18 1345  TempSrc:   PainSc: 0-No pain                 [name]

## 2018-10-12 NOTE — Anesthesia Procedure Notes (Signed)
Procedure Name: Intubation Date/Time: 10/12/2018 7:47 AM Performed by: Moshe Salisbury, CRNA Pre-anesthesia Checklist: Patient identified, Emergency Drugs available, Suction available and Patient being monitored Patient Re-evaluated:Patient Re-evaluated prior to induction Oxygen Delivery Method: Circle System Utilized Preoxygenation: Pre-oxygenation with 100% oxygen Induction Type: IV induction Ventilation: Mask ventilation without difficulty Laryngoscope Size: Mac and 3 Grade View: Grade I Tube type: Oral Tube size: 7.5 mm Number of attempts: 1 Airway Equipment and Method: Stylet and Oral airway Placement Confirmation: ETT inserted through vocal cords under direct vision,  positive ETCO2 and breath sounds checked- equal and bilateral Secured at: 21 cm Tube secured with: Tape Dental Injury: Teeth and Oropharynx as per pre-operative assessment

## 2018-10-12 NOTE — Op Note (Signed)
OPERATIVE REPORT  10/12/2018  Kelsey Rojas  PREOPERATIVE DIAGNOSIS:  Bilateral Macromastia.  POSTOPERATIVE DIAGNOSIS:  Bilateral Macromastia.  PROCEDURE:  Bilateral Reduction Mammoplasties.  ATTENDING SURGEON:  Eloise Levels, MD  ANESTHESIA:  General.  ANESTHESIOLOGIST: M. Malen Gauze, MD  COMPLICATIONS:  None.  INDICATIONS FOR THE PROCEDURE:  The patient is a 22 y.o. female who has bilateral macromastia that is clinically symptomatic.  She presents to undergo bilateral reduction mammoplasties.  DESCRIPTION OF PROCEDURE:  The patient was marked in preop holding area in a pattern of Wise for the future bilateral reduction mammoplasties. She was then taken back to the OR, placed on the table in supine position.  After adequate general anesthesia was obtained, the patient's chest was prepped with Techni-Care and draped in sterile fashion.  The bases of the breasts have been infiltrated with 1% lidocaine with epinephrine.  After adequate hemostasis and anesthesia taken effect, the procedure was begun.  Both of the breast reductions were performed in the following similar manner.  The nipple-areolar complex was marked with a 45-mm nipple marker.  The skin was then incised and deepithelialized around the nipple-areolar complex down to the inframammary crease in the inferior pedicle pattern.  Next, the medial, superior, and lateral skin flaps were elevated down to the chest wall.  Excess fat and glandular tissue removed from the inferior pedicle.  The nipple-areolar complex was examined and found to be pink and viable.  The wound was irrigated with saline irrigation.  Meticulous hemostasis was obtained with the Bovie electrocautery.  Inferior pedicle was centralized using 3-0 Prolene suture.  A #10 JP flat fully fluted drain was placed into the wound. The skin flaps were brought together at the inverted T junction with a 2- 0 Prolene suture.  The incisions were stapled for  temporary closure. The breasts compared and found to have good shape and symmetry.  The incisions were then closed from the medial aspect of the JP drain to the medial aspect of the [facility] incision by first placing a few 3-0 Monocryl sutures to tack together the dermal layer, and then both the dermal and cuticular layer were closed in a single layer using a 3-0 V-lock PDO barbed suture.  Lateral to the JP drain incision was closed using 3-0 Monocryl in the dermal layer, followed by 3-0 Monocryl running intracuticular stitch on the skin.  The vertical limb of the Wise pattern was closed in the dermal layer using 3-0 Monocryl suture.  The patient was placed in the upright position.  The future location of the nipple-areolar complexes was marked on both breast mounds using the 45-mm nipple marker.  She was then placed back in the recumbent position.  Both of the nipple areolar complexes were brought out onto the breast mounds in the following similar manner.  The skin was incised as marked and removed in full thickness into the subcutaneous tissues.  The nipple- areolar complex was examined, found to be pink and viable, then brought out through this aperture and sewn in place using 4-0 Monocryl in the dermal layer, followed by 5-0 Monocryl running intracuticular stitch on the skin.  This 5-0 Monocryl suture was then brought down to close the cuticular layer of the vertical limb as well.  The JP drain was sewn in place using 3-0 nylon suture.  The pectoralis major muscle and fascia along with the breast and chest soft tissues were then infiltrated with 1% Exparel (total 266 mg).  Now the [facility] incision was also infiltrated with  the Exparel in order to give the patient postoperative pain control.  The incisions were dressed with benzoin, Steri-Strips, and the nipples dressed with bacitracin ointment and Adaptic.  4x4s were placed over the incisions and ABD pads in the axillary areas.  The patient  was placed into a light postoperative support bra.  There were no complications. The patient tolerated the procedure well.  The final needle, sponge counts were reported to be correct at the end of the case.  The patient was then recovered without complications.  Both the patient and her family were given proper postoperative wound care instructions. She was then discharged home in the care of her family in stable condition.  Follow up will be with me in a few days in the office.         Eloise LevelsMary Ann Blakelynn Rojas, M.D.  10/12/2018 1:00 PM

## 2018-10-12 NOTE — Transfer of Care (Signed)
Immediate Anesthesia Transfer of Care Note  Patient: Kelsey Rojas  Procedure(s) Performed: BILATERAL MAMMARY REDUCTION  (BREAST) (Bilateral Breast)  Patient Location: PACU  Anesthesia Type:General  Level of Consciousness: patient cooperative and responds to stimulation  Airway & Oxygen Therapy: Patient Spontanous Breathing and Patient connected to nasal cannula oxygen  Post-op Assessment: Report given to RN and Post -op Vital signs reviewed and stable  Post vital signs: Reviewed and stable  Last Vitals:  Vitals Value Taken Time  BP 152/91 10/12/2018  1:13 PM  Temp    Pulse 102 10/12/2018  1:15 PM  Resp 24 10/12/2018  1:15 PM  SpO2 95 % 10/12/2018  1:15 PM  Vitals shown include unvalidated device data.  Last Pain:  Vitals:   10/12/18 0648  TempSrc:   PainSc: 0-No pain      Patients Stated Pain Goal: 6 (10/12/18 [id number])  Complications: No apparent anesthesia complications

## 2018-10-13 ENCOUNTER — Encounter (HOSPITAL_COMMUNITY): Payer: Self-pay | Admitting: Plastic Surgery

## 2019-03-14 ENCOUNTER — Emergency Department (HOSPITAL_BASED_OUTPATIENT_CLINIC_OR_DEPARTMENT_OTHER)
Admission: EM | Admit: 2019-03-14 | Discharge: 2019-03-14 | Disposition: A | Discharge status: Home / Self Care | Payer: 59 | Attending: Emergency Medicine | Admitting: Emergency Medicine

## 2019-03-14 ENCOUNTER — Other Ambulatory Visit: Payer: Self-pay

## 2019-03-14 ENCOUNTER — Encounter (HOSPITAL_BASED_OUTPATIENT_CLINIC_OR_DEPARTMENT_OTHER): Payer: Self-pay | Admitting: *Deleted

## 2019-03-14 DIAGNOSIS — M545 Low back pain, unspecified: Secondary | ICD-10-CM

## 2019-03-14 LAB — URINALYSIS, ROUTINE W REFLEX MICROSCOPIC
Bilirubin Urine: NEGATIVE
Glucose, UA: NEGATIVE mg/dL
Hgb urine dipstick: NEGATIVE
Ketones, ur: NEGATIVE mg/dL
Leukocytes,Ua: NEGATIVE
Nitrite: NEGATIVE
Protein, ur: NEGATIVE mg/dL
Specific Gravity, Urine: >1.03 — ABNORMAL HIGH (ref 1.005–1.030)
pH: 6.5 (ref 5.0–8.0)

## 2019-03-14 LAB — PREGNANCY, URINE: Preg Test, Ur: NEGATIVE

## 2019-03-14 NOTE — ED Provider Notes (Signed)
[facility] EMERGENCY DEPARTMENT Provider Note   CSN: [id number] Arrival date & time: 03/14/19  2145    History   Chief Complaint Chief Complaint  Patient presents with   Back Pain    HPI Kelsey Rojas is a 23 y.o. female with PMH/o GERD, Macromastia, Migraines who presents for evaluation of 1 week of lower back pain.  She states that pain radiates to her sides.  She reports pain is worse with movement and walking.  She denies any preceding trauma, injury, fall.  Patient reports she took 1 dose of muscle relaxers and continued to have pain.  She has not taken any other medications.  Patient states that the pain does not radiate into the abdomen, pelvis or vagina.  Patient states she has not had any nausea/vomiting, dysuria, hematuria, urinary frequency.  She has not noticed any vaginal bleeding or vaginal discharge.  Patient states she is also had some itching in her bilateral ears.  Denies any fevers. Denies fevers, weight loss, numbness/weakness of upper and lower extremities, bowel/bladder incontinence, saddle anesthesia, history of back surgery, history of IVDA.      The history is provided by the patient.    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   Past Surgical History:  Procedure Laterality Date   BREAST REDUCTION SURGERY Bilateral 10/12/2018   Procedure: BILATERAL MAMMARY REDUCTION  (BREAST);  Surgeon: Contogiannis, Chales Abrahams, MD;  Location: [facility] OR;  Service: Plastics;  Laterality: Bilateral;  BILATERAL MAMMARY REDUCTION  (BREAST)   UPPER GASTROINTESTINAL ENDOSCOPY       OB History   No obstetric history on file.      Home Medications    Prior to Admission medications   Medication Sig Start Date End Date Taking? Authorizing Provider  pantoprazole (PROTONIX) 40 MG tablet Take 40 mg by mouth daily.    [provider]    Family History History reviewed. No pertinent family history.  Social History Social History   Tobacco Use   Smoking status: Never Smoker   Smokeless tobacco: Never Used  Substance Use Topics   Alcohol use: Yes    Frequency: Never    Comment: occasionally   Drug use: No     Allergies   Ciprofloxacin and Penicillins   Review of Systems Review of Systems  Constitutional: Negative for fever.  HENT: Positive for ear pain.   Respiratory: Negative for cough and shortness of breath.   Cardiovascular: Negative for chest pain.  Gastrointestinal: Negative for abdominal pain, nausea and vomiting.  Genitourinary: Negative for dysuria and hematuria.  Musculoskeletal: Positive for back pain.  Neurological: Negative for weakness and numbness.  All other systems reviewed and are negative.    Physical Exam Updated Vital Signs BP (!) 147/87    Pulse 94    Temp 98.4 F (36.9 C) (Oral)    Resp 18    Ht  (1.6 m)    Wt 129.3 kg    LMP 03/13/2018    SpO2 98%    BMI 50.49 kg/m   Physical Exam Vitals signs and nursing note reviewed.  Constitutional:      Appearance: Normal appearance. She is well-developed.  HENT:     Head: Normocephalic and atraumatic.     Right Ear: Tympanic membrane normal.     Left Ear: Tympanic membrane normal.  Eyes:     General: Lids are normal.     Conjunctiva/sclera: Conjunctivae normal.     Pupils: Pupils are equal, round, and reactive to light.  Neck:     Musculoskeletal: Full passive range of motion without pain.  Cardiovascular:     Rate and Rhythm: Normal rate and regular rhythm.     Pulses: Normal pulses.     Heart sounds: Normal heart sounds. No murmur. No friction rub. No gallop.   Pulmonary:     Effort: Pulmonary effort is normal.  Abdominal:     Palpations: Abdomen is soft. Abdomen is not rigid.     Tenderness: There is no abdominal tenderness. There is no guarding. Negative signs include McBurney's sign.      Comments: Abdomen is soft, non-distended, non-tender. No rigidity, No guarding. No peritoneal signs. No tenderness noted at McBurney's point.   Musculoskeletal: Normal range of motion.     Thoracic back: She exhibits no tenderness.       Back:     Comments: No midline tenderness to palpation noted to the midline thoracic. Diffuse muscular tenderness noted to the lower lumbar region that extends over midline. No deformity or crepitus.   Skin:    General: Skin is warm and dry.     Capillary Refill: Capillary refill takes less than 2 seconds.  Neurological:     Mental Status: She is alert and oriented to person, place, and time.     Comments: Follows commands, Moves all extremities  5/5 strength to BUE and BLE  Sensation intact throughout all major nerve distributions  Psychiatric:        Speech: Speech normal.      ED Treatments / Results  Labs (all labs ordered are listed, but only abnormal results are displayed) Labs Reviewed  URINALYSIS, ROUTINE W REFLEX MICROSCOPIC - Abnormal; Notable for the following components:      Result Value   APPearance CLOUDY (*)    Specific Gravity, Urine >1.030 (*)    All other components within normal limits  PREGNANCY, URINE    EKG None  Radiology No results found.  Procedures Procedures (including critical care time)  Medications Ordered in ED Medications - No data to display   Initial Impression / Assessment and Plan / ED Course  I have reviewed the triage vital signs and the nursing notes.  Pertinent labs & imaging results that were available during my care of the patient were reviewed by me and considered in my medical decision making (see chart for details).        23 y.o. F with PMH/o GERD, Macromastia, Migraines who presents for evaluation of lower back pain x1 week. No preceding trauma, injury or fall. Patient reports she took one dose of muscle relaxer with no improvement.  She has not taken any other medications.  Patient  states he goes to her sides but does not radiate into the abdomen or pelvis.  Patient denies any vaginal discharge, bleeding.  She has not had any dysuria, hematuria. Patient is afebrile, non-toxic appearing, sitting comfortably on examination table. Vital signs reviewed and stable.  No neuro deficits noted on exam.  Patient with diffuse muscular tenderness into the lower lumbar region.  No deformity or crepitus noted.  Normal neuro exam.  Patient with no abdominal tenderness.  Doubt intra-abdominal process.  Suspect musculoskeletal pain.  We will plan to check UA, urine pregnancy.  Patient with no history of trauma, injury.  No indication for imaging at this  time. History/Physical exam not concerning for cauda equina, spinal abscess. Doubt PID.   Urine pregnancy negative.  UA negative for any infectious etiology.  Discussed results with patient.  Discussed with her that this most likely musculoskeletal etiology.  Patient only took one-time dose of muscle relaxers.  Encouraged continued anti-inflammatory and I will defer of care measures. At this time, patient exhibits no emergent life-threatening condition that require further evaluation in ED or admission. Patient had ample opportunity for questions and discussion. All patient's questions were answered with full understanding. Strict return precautions discussed. Patient expresses understanding and agreement to plan.   Portions of this note were generated with Scientist, clinical (histocompatibility and immunogenetics)Dragon dictation software. Dictation errors may occur despite best attempts at proofreading.    Final Clinical Impressions(s) / ED Diagnoses   Final diagnoses:  Acute bilateral low back pain, unspecified whether sciatica present    ED Discharge Orders    None       Maxwell CaulLayden, Shaquasha Gerstel A, PA-C 03/15/19 0007    Rolan BuccoBelfi, Melanie, MD 03/15/19 1457

## 2019-03-14 NOTE — ED Notes (Signed)
Pt understood dc material. NAD noted. All questions given at dc. Pt escorted to checkout window.

## 2019-03-14 NOTE — ED Triage Notes (Signed)
Pt c/o lower back pain which radiates around to pelvis  x 1 week

## 2019-03-14 NOTE — ED Notes (Signed)
ED Provider at bedside. 

## 2019-03-14 NOTE — Discharge Instructions (Signed)
You can take Tylenol or Ibuprofen as directed for pain. You can alternate Tylenol and Ibuprofen every 4 hours. If you take Tylenol at 1pm, then you can take Ibuprofen at 5pm. Then you can take Tylenol again at 9pm.   Take the muscle relaxers as directed.  Apply heat to help with pain.  Return to the Emergency Department immediately for any worsening back pain, neck pain, difficulty walking, numbness/weaknss of your arms or legs, urinary or bowel accidents, fever, nausea/vomiting, abdominal pain or any other worsening concerning symptoms or any other worsening or concerning symptoms.

## 2020-01-27 ENCOUNTER — Emergency Department (HOSPITAL_BASED_OUTPATIENT_CLINIC_OR_DEPARTMENT_OTHER)
Admission: EM | Admit: 2020-01-27 | Discharge: 2020-01-27 | Disposition: A | Discharge status: Home / Self Care | Payer: HRSA Program | Attending: Emergency Medicine | Admitting: Emergency Medicine

## 2020-01-27 ENCOUNTER — Emergency Department (HOSPITAL_BASED_OUTPATIENT_CLINIC_OR_DEPARTMENT_OTHER): Payer: HRSA Program

## 2020-01-27 ENCOUNTER — Encounter (HOSPITAL_BASED_OUTPATIENT_CLINIC_OR_DEPARTMENT_OTHER): Payer: Self-pay | Admitting: Emergency Medicine

## 2020-01-27 ENCOUNTER — Other Ambulatory Visit: Payer: Self-pay

## 2020-01-27 DIAGNOSIS — R519 Headache, unspecified: Secondary | ICD-10-CM | POA: Diagnosis present

## 2020-01-27 DIAGNOSIS — Z79899 Other long term (current) drug therapy: Secondary | ICD-10-CM | POA: Diagnosis not present

## 2020-01-27 DIAGNOSIS — U071 COVID-19: Secondary | ICD-10-CM | POA: Insufficient documentation

## 2020-01-27 LAB — CBC WITH DIFFERENTIAL/PLATELET
Abs Immature Granulocytes: 0.01 10*3/uL (ref 0.00–0.07)
Basophils Absolute: 0 10*3/uL (ref 0.0–0.1)
Basophils Relative: 0 %
Eosinophils Absolute: 0 10*3/uL (ref 0.0–0.5)
Eosinophils Relative: 0 %
HCT: 44.1 % (ref 36.0–46.0)
Hemoglobin: 13.5 g/dL (ref 12.0–15.0)
Immature Granulocytes: 0 %
Lymphocytes Relative: 49 %
Lymphs Abs: 1.9 10*3/uL (ref 0.7–4.0)
MCH: 27.4 pg (ref 26.0–34.0)
MCHC: 30.6 g/dL (ref 30.0–36.0)
MCV: 89.5 fL (ref 80.0–100.0)
Monocytes Absolute: 0.5 10*3/uL (ref 0.1–1.0)
Monocytes Relative: 11 %
Neutro Abs: 1.6 10*3/uL — ABNORMAL LOW (ref 1.7–7.7)
Neutrophils Relative %: 40 %
Platelets: 185 10*3/uL (ref 150–400)
RBC: 4.93 MIL/uL (ref 3.87–5.11)
RDW: 13.1 % (ref 11.5–15.5)
WBC: 4 10*3/uL (ref 4.0–10.5)
nRBC: 0 % (ref 0.0–0.2)

## 2020-01-27 LAB — COMPREHENSIVE METABOLIC PANEL
ALT: 35 U/L (ref 0–44)
AST: 33 U/L (ref 15–41)
Albumin: 4 g/dL (ref 3.5–5.0)
Alkaline Phosphatase: 62 U/L (ref 38–126)
Anion gap: 7 (ref 5–15)
BUN: 8 mg/dL (ref 6–20)
CO2: 26 mmol/L (ref 22–32)
Calcium: 8.4 mg/dL — ABNORMAL LOW (ref 8.9–10.3)
Chloride: 105 mmol/L (ref 98–111)
Creatinine, Ser: 0.93 mg/dL (ref 0.44–1.00)
GFR calc Af Amer: >60 mL/min (ref >60)
GFR calc non Af Amer: >60 mL/min (ref >60)
Glucose, Bld: 104 mg/dL — ABNORMAL HIGH (ref 70–99)
Potassium: 3.8 mmol/L (ref 3.5–5.1)
Sodium: 138 mmol/L (ref 135–145)
Total Bilirubin: 0.5 mg/dL (ref 0.3–1.2)
Total Protein: 7.3 g/dL (ref 6.5–8.1)

## 2020-01-27 LAB — SARS CORONAVIRUS 2 AG (30 MIN TAT): SARS Coronavirus 2 Ag: POSITIVE — AB

## 2020-01-27 MED ORDER — METOCLOPRAMIDE HCL 5 MG/ML IJ SOLN
10.0000 mg | Freq: Once | INTRAMUSCULAR | Status: AC
  Administered 2020-01-27: 10 mg via INTRAVENOUS
  Filled 2020-01-27: qty 2

## 2020-01-27 MED ORDER — SODIUM CHLORIDE 0.9 % IV BOLUS
1000.0000 mL | Freq: Once | INTRAVENOUS | Status: AC
  Administered 2020-01-27: 10:00:00 1000 mL via INTRAVENOUS

## 2020-01-27 MED ORDER — DIPHENHYDRAMINE HCL 50 MG/ML IJ SOLN
25.0000 mg | Freq: Once | INTRAMUSCULAR | Status: AC
  Administered 2020-01-27: 10:00:00 25 mg via INTRAVENOUS
  Filled 2020-01-27: qty 1

## 2020-01-27 MED ORDER — ACETAMINOPHEN 325 MG PO TABS
650.0000 mg | ORAL_TABLET | Freq: Once | ORAL | Status: AC
  Administered 2020-01-27: 650 mg via ORAL
  Filled 2020-01-27: qty 2

## 2020-01-27 NOTE — ED Provider Notes (Signed)
[facility] EMERGENCY DEPARTMENT Provider Note   CSN: [id number] Arrival date & time: 01/27/20  9323     History Chief Complaint  Patient presents with  . Headache    Kelsey Rojas is a 24 y.o. female.   Headache Associated symptoms: cough, fever and nausea   Associated symptoms: no abdominal pain, no neck pain and no vomiting       24 year old female, no significant medical history, presents with headache and fever.  Patient states she has a frontal headache for the last 5 days.  She describes it as a sharp pain between the eyes.  She was seen and evaluated for this at urgent care on Monday and told she tested negative for COVID-19 and the flu.  She was also told she did not have a urinary tract infection.  She has been managing her symptoms with Tylenol with minimal improvement.  She notes some associated nausea and a mild cough.  She denies any abdominal pain, vomiting, neck pain or stiffness, rashes.  He denies any chest pain or shortness of breath.     Past Medical History:  Diagnosis Date  . GERD (gastroesophageal reflux disease)   . 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 Migraines     There are no problems to display for this patient.   Past Surgical History:  Procedure Laterality Date  . BREAST REDUCTION SURGERY Bilateral 10/12/2018   Procedure: BILATERAL MAMMARY REDUCTION  (BREAST);  Surgeon: Contogiannis, Audrea Muscat, MD;  Location: [facility];  Service: Plastics;  Laterality: Bilateral;  BILATERAL MAMMARY REDUCTION  (BREAST)  . UPPER GASTROINTESTINAL ENDOSCOPY       OB History   No obstetric history on file.     No family history on file.  Social History   Tobacco Use  . Smoking status: Never Smoker  . Smokeless tobacco: Never Used  Substance Use Topics  . Alcohol use: Yes    Comment: occasionally  . Drug use: No    Home Medications Prior to Admission medications   Medication Sig Start Date End Date Taking? Authorizing Provider  nitrofurantoin,  macrocrystal-monohydrate, (MACROBID) 100 MG capsule Take 100 mg by mouth 2 (two) times daily. 01/25/20   [provider]  omeprazole (PRILOSEC) 20 MG capsule Take 20 mg by mouth daily. 11/29/19   [provider]    Allergies    Penicillins, Ciprofloxacin, and Amoxicillin  Review of Systems   Review of Systems  Constitutional: Positive for chills and fever.  Respiratory: Positive for cough. Negative for shortness of breath.   Cardiovascular: Negative for chest pain.  Gastrointestinal: Positive for nausea. Negative for abdominal pain and vomiting.  Genitourinary: Negative for dysuria and urgency.  Musculoskeletal: Negative for neck pain.  Neurological: Positive for headaches.    Physical Exam Updated Vital Signs BP 134/86   Pulse (!) 106   Temp 100.3 F (37.9 C) (Oral)   LMP 12/16/2019   SpO2 99%   Physical Exam Vitals and nursing note reviewed.  Constitutional:      Appearance: She is well-developed.  HENT:     Head: Normocephalic and atraumatic.  Eyes:     Conjunctiva/sclera: Conjunctivae normal.  Cardiovascular:     Rate and Rhythm: Regular rhythm. Tachycardia present.     Heart sounds: Normal heart sounds. No murmur.  Pulmonary:     Effort: Pulmonary effort is normal. No respiratory distress.     Breath sounds: Normal breath sounds. No wheezing or rales.  Abdominal:     General: Bowel sounds are  normal. There is no distension.     Palpations: Abdomen is soft.     Tenderness: There is no abdominal tenderness.  Musculoskeletal:        General: No tenderness or deformity. Normal range of motion.     Cervical back: Neck supple.  Skin:    General: Skin is warm and dry.     Findings: No erythema or rash.  Neurological:     General: No focal deficit present.     Mental Status: She is alert and oriented to person, place, and time.     Cranial Nerves: Cranial nerves are intact.     Sensory: Sensation is intact.     Motor: Motor function is intact.      Coordination: Coordination is intact.  Psychiatric:        Behavior: Behavior normal.     ED Results / Procedures / Treatments   Labs (all labs ordered are listed, but only abnormal results are displayed) Labs Reviewed  SARS CORONAVIRUS 2 AG (30 MIN TAT)  CBC WITH DIFFERENTIAL/PLATELET  COMPREHENSIVE METABOLIC PANEL  URINALYSIS, ROUTINE W REFLEX MICROSCOPIC  PREGNANCY, URINE    EKG None  Radiology No results found.  Procedures Procedures (including critical care time)  Medications Ordered in ED Medications  acetaminophen (TYLENOL) tablet 650 mg (has no administration in time range)  metoCLOPramide (REGLAN) injection 10 mg (has no administration in time range)  diphenhydrAMINE (BENADRYL) injection 25 mg (has no administration in time range)  sodium chloride 0.9 % bolus 1,000 mL (has no administration in time range)    ED Course  I have reviewed the triage vital signs and the nursing notes.  Pertinent labs & imaging results that were available during my care of the patient were reviewed by me and considered in my medical decision making (see chart for details).    MDM Rules/Calculators/A&P                       Patient presents with complaints of headache, fever, cough, nausea.  Patient well-appearing, no acute distress, nontoxic, non-lethargic.  Patient with a temp of 100.3, slightly tachycardic.  Neuro exam nonfocal, nonlateralizing.  Lungs clear to auscultation throughout.  No increased work of breathing or accessory muscle use.  Chest x-ray shows no evidence of pneumonia, pneumothorax, pleural effusion.  CBC and CMP unremarkable.  Patient unable to give a urine specimen however she does state that it was checked in urgent care this week and within normal limits.  Patient's COVID-19 antigen swab is positive.  This is likely the cause of patient's symptoms.  On reevaluation she continues to be very well-appearing, no acute distress.  She states her headache has resolved  completely.  Will discharge with instructions for quarantine and return precautions.  Patient agreeable with plan, ready and stable for discharge.    At this time there does not appear to be any evidence of an acute emergency medical condition and the patient appears stable for discharge with appropriate outpatient follow up.Diagnosis was discussed with patient who verbalizes understanding and is agreeable to discharge.     Kelsey Rojas was evaluated in Emergency Department on 01/27/2020 for the symptoms described in the history of present illness. She was evaluated in the context of the global COVID-19 pandemic, which necessitated consideration that the patient might be at risk for infection with the SARS-CoV-2 virus that causes COVID-19. Institutional protocols and algorithms that pertain to the evaluation of patients at risk for COVID-19 are  in a state of rapid change based on information released by regulatory bodies including the CDC and federal and state organizations. These policies and algorithms were followed during the patient's care in the ED.    Final Clinical Impression(s) / ED Diagnoses Final diagnoses:  None    Rx / DC Orders ED Discharge Orders    None       Rueben Bash 01/27/20 1731    Pollyann Savoy, MD 01/28/20 [phone]

## 2020-01-27 NOTE — ED Triage Notes (Addendum)
Pt has been having a headache above her eyes since Monday.  Low grade fever at home.  Denies any drainage at this point.  Pt seen at urgent care this week for same, was given abx for ?UTI but patient states her urine came back normal.

## 2020-01-27 NOTE — ED Notes (Signed)
Pt attempted, but unable to provide urine sample; sts she voided just PTA

## 2020-01-27 NOTE — Discharge Instructions (Addendum)
Drink plenty fluids.  Alternate Tylenol and Motrin as needed for headache, body aches or fever.  Please self quarantine until you meet the CDC criteria listed below.  Return to the emergency room immediately for new or worsening symptoms or concerns, such as chest pain, shortness of breath, vomiting or any concerns at all.   CDC criteria to return to work: 1.  10 days and symptom onset AND 2.  3 days fever free without the use of Tylenol or Motrin AND 3.  Symptoms have improved or resolved.

## 2020-01-28 ENCOUNTER — Encounter: Payer: Self-pay | Admitting: Physician Assistant

## 2020-01-28 ENCOUNTER — Telehealth: Payer: Self-pay | Admitting: Physician Assistant

## 2020-01-28 NOTE — Telephone Encounter (Signed)
Called to discuss with patient about Covid symptoms and the use of bamlanivimab or casirivimab/imdevimab, a monoclonal antibody infusion for those with mild to moderate Covid symptoms and at a high risk of hospitalization.  Pt is qualified for this infusion at the [facility] infusion center due to [employer] left to call back. Also sent MyChart message   Cline Crock PA-C  [facility]

## 2020-02-02 ENCOUNTER — Emergency Department (HOSPITAL_BASED_OUTPATIENT_CLINIC_OR_DEPARTMENT_OTHER): Payer: Self-pay

## 2020-02-02 ENCOUNTER — Other Ambulatory Visit: Payer: Self-pay

## 2020-02-02 ENCOUNTER — Emergency Department (HOSPITAL_BASED_OUTPATIENT_CLINIC_OR_DEPARTMENT_OTHER)
Admission: EM | Admit: 2020-02-02 | Discharge: 2020-02-03 | Disposition: A | Discharge status: Home / Self Care | Payer: Self-pay | Attending: Emergency Medicine | Admitting: Emergency Medicine

## 2020-02-02 ENCOUNTER — Encounter (HOSPITAL_BASED_OUTPATIENT_CLINIC_OR_DEPARTMENT_OTHER): Payer: Self-pay | Admitting: Emergency Medicine

## 2020-02-02 DIAGNOSIS — Z79899 Other long term (current) drug therapy: Secondary | ICD-10-CM | POA: Insufficient documentation

## 2020-02-02 DIAGNOSIS — R05 Cough: Secondary | ICD-10-CM | POA: Insufficient documentation

## 2020-02-02 DIAGNOSIS — R0602 Shortness of breath: Secondary | ICD-10-CM | POA: Insufficient documentation

## 2020-02-02 DIAGNOSIS — U071 COVID-19: Secondary | ICD-10-CM | POA: Insufficient documentation

## 2020-02-02 DIAGNOSIS — R112 Nausea with vomiting, unspecified: Secondary | ICD-10-CM | POA: Insufficient documentation

## 2020-02-02 DIAGNOSIS — M7918 Myalgia, other site: Secondary | ICD-10-CM | POA: Insufficient documentation

## 2020-02-02 MED ORDER — ACETAMINOPHEN 325 MG PO TABS
650.0000 mg | ORAL_TABLET | Freq: Once | ORAL | Status: AC | PRN
  Administered 2020-02-02: 650 mg via ORAL
  Filled 2020-02-02: qty 2

## 2020-02-02 NOTE — ED Provider Notes (Signed)
[facility] Provider Note: Lowella Dell, MD, FACEP  CSN: [id number] MRN: [id number] ARRIVAL: 02/02/20 at 2321 ROOM: MH11/[facility]   CHIEF COMPLAINT  COVID19   HISTORY OF PRESENT ILLNESS  02/02/20 11:52 PM Kelsey Rojas is a 24 y.o. female who has had viral illness symptoms for the past 9 days.  Specifically she has had fever, body aches, chills, nausea, vomiting and diarrhea.  She has had a cough which has worsened the past 2 days as well as shortness of breath.  Symptoms are moderate.  She has had decreased oral intake due to nausea and vomiting, nausea worse with movement.  She denies pain presently.  Her temperature was 102.4 on arrival and she was given Tylenol.   Past Medical History:  Diagnosis Date  . GERD (gastroesophageal reflux disease)   . 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 Migraines   . Morbid obesity (HCC)     Past Surgical History:  Procedure Laterality Date  . BREAST REDUCTION SURGERY Bilateral 10/12/2018   Procedure: BILATERAL MAMMARY REDUCTION  (BREAST);  Surgeon: Contogiannis, Chales Abrahams, MD;  Location: [facility] OR;  Service: Plastics;  Laterality: Bilateral;  BILATERAL MAMMARY REDUCTION  (BREAST)  . UPPER GASTROINTESTINAL ENDOSCOPY      No family history on file.  Social History   Tobacco Use  . Smoking status: Never Smoker  . Smokeless tobacco: Never Used  Substance Use Topics  . Alcohol use: Yes    Comment: occasionally  . Drug use: No    Prior to Admission medications   Medication Sig Start Date End Date Taking? Authorizing Provider  omeprazole (PRILOSEC) 20 MG capsule Take 20 mg by mouth daily. 11/29/19   [provider]  ondansetron (ZOFRAN ODT) 8 MG disintegrating tablet Take 1 tablet (8 mg total) by mouth every 8 (eight) hours as needed for nausea or vomiting. 02/03/20   Raylyn Speckman, MD    Allergies Penicillins, Ciprofloxacin, and Amoxicillin   REVIEW OF SYSTEMS  Negative except as noted here or in the History of Present Illness.    PHYSICAL EXAMINATION  Initial Vital Signs Blood pressure 130/85, pulse (!) 118, temperature (!) 102.4 F (39.1 C), temperature source Oral, resp. rate 18, height 5\' 3"  (1.6 m), weight 123.5 kg, last menstrual period 12/16/2019, SpO2 96 %.  Examination General: Well-developed, well-nourished female in no acute distress; appearance consistent with age of record HENT: normocephalic; atraumatic Eyes: pupils equal, round and reactive to light; extraocular muscles intact Neck: supple Heart: regular rate and rhythm; tachycardia Lungs: Decreased air movement bilaterally Abdomen: soft; nondistended; nontender; bowel sounds present Extremities: No deformity; full range of motion; pulses normal Neurologic: Awake, alert and oriented; motor function intact in all extremities and symmetric; no facial droop Skin: Warm and dry Psychiatric: Normal mood and affect   RESULTS  Summary of this visit's results, reviewed and interpreted by myself:   EKG Interpretation  Date/Time:    Ventricular Rate:    PR Interval:    QRS Duration:   QT Interval:    QTC Calculation:   R Axis:     Text Interpretation:        Laboratory Studies: No results found for this or any previous visit (from the past 24 hour(s)). Imaging Studies: DG Chest Port 1 View  Result Date: 02/03/2020 CLINICAL DATA:  24 year old female with cough. Positive COVID-19. EXAM: PORTABLE CHEST 1 VIEW COMPARISON:  Chest radiograph dated 01/27/2020. FINDINGS: No focal consolidation, pleural effusion, pneumothorax. Top-normal cardiac silhouette. No acute osseous pathology. IMPRESSION: No acute cardiopulmonary  process. Electronically Signed   By: Anner Crete M.D.   On: 02/03/2020 00:10    ED COURSE and MDM  Nursing notes, initial and subsequent vitals signs, including pulse oximetry, reviewed and interpreted by myself.  Vitals:   02/02/20 2329 02/03/20 0014 02/03/20 0106  BP: 130/85  108/74  Pulse: (!) 118  (!) 105  Resp: 18  18   Temp: (!) 102.4 F (39.1 C) (!) 101.6 F (38.7 C) 99.8 F (37.7 C)  TempSrc: Oral Oral Oral  SpO2: 96%  97%  Weight: 123.5 kg    Height: 5\' 3"  (1.6 m)     Medications  albuterol (VENTOLIN HFA) 108 (90 Base) MCG/ACT inhaler 2 puff (2 puffs Inhalation Given 02/03/20 0021)  acetaminophen (TYLENOL) tablet 650 mg (650 mg Oral Given 02/02/20 2334)  ondansetron (ZOFRAN-ODT) disintegrating tablet 8 mg (8 mg Oral Given 02/03/20 0014)   No evidence of secondary pneumonia on chest x-ray.  Delayed presentation of symptoms in Covid patients is not uncommon.  She was advised to continue over-the-counter analgesics as needed.  We will provide her an albuterol inhaler for assistance in breathing.   PROCEDURES  Procedures   ED DIAGNOSES     ICD-10-CM   1. COVID-19 virus infection  U07.1        Taimur Fier, MD 02/03/20 6546

## 2020-02-02 NOTE — ED Triage Notes (Signed)
Pt arrives with cough x2 days, had a positive COVID 19 test on 3/12. Reports some shortness of breath, speaking in full sentences in triage.

## 2020-02-02 NOTE — ED Notes (Signed)
Ambulated patient to treatment room on pulse oximeter, SpO2 steady at 97% on room air heart rate up to 131.

## 2020-02-03 MED ORDER — ONDANSETRON 8 MG PO TBDP: 8.0000 mg | ORAL_TABLET | Freq: Three times a day (TID) | ORAL | 0 refills | Status: DC | PRN

## 2020-02-03 MED ORDER — ALBUTEROL SULFATE HFA 108 (90 BASE) MCG/ACT IN AERS
2.0000 | INHALATION_SPRAY | RESPIRATORY_TRACT | Status: DC | PRN
  Administered 2020-02-03: 2 via RESPIRATORY_TRACT
  Filled 2020-02-03: qty 6.7

## 2020-02-03 MED ORDER — ONDANSETRON 4 MG PO TBDP
8.0000 mg | ORAL_TABLET | Freq: Once | ORAL | Status: AC
  Administered 2020-02-03: 8 mg via ORAL
  Filled 2020-02-03: qty 2

## 2021-05-13 ENCOUNTER — Emergency Department (HOSPITAL_BASED_OUTPATIENT_CLINIC_OR_DEPARTMENT_OTHER)
Admission: EM | Admit: 2021-05-13 | Discharge: 2021-05-13 | Disposition: A | Discharge status: Home / Self Care | Payer: BC Managed Care – PPO | Attending: Emergency Medicine | Admitting: Emergency Medicine

## 2021-05-13 ENCOUNTER — Other Ambulatory Visit: Payer: Self-pay

## 2021-05-13 ENCOUNTER — Encounter (HOSPITAL_BASED_OUTPATIENT_CLINIC_OR_DEPARTMENT_OTHER): Payer: Self-pay | Admitting: Emergency Medicine

## 2021-05-13 DIAGNOSIS — R519 Headache, unspecified: Secondary | ICD-10-CM | POA: Diagnosis present

## 2021-05-13 DIAGNOSIS — J0100 Acute maxillary sinusitis, unspecified: Secondary | ICD-10-CM | POA: Diagnosis not present

## 2021-05-13 DIAGNOSIS — I10 Essential (primary) hypertension: Secondary | ICD-10-CM | POA: Diagnosis not present

## 2021-05-13 MED ORDER — AMOXICILLIN-POT CLAVULANATE 875-125 MG PO TABS: 1.0000 | ORAL_TABLET | Freq: Once | ORAL | Status: DC

## 2021-05-13 MED ORDER — AZITHROMYCIN 250 MG PO TABS: 250.0000 mg | ORAL_TABLET | Freq: Every day | ORAL | 0 refills | Status: DC

## 2021-05-13 NOTE — Discharge Instructions (Addendum)
Take tylenol 2 pills 4 times a day and motrin 4 pills 3 times a day.  Drink plenty of fluids.  Return for worsening shortness of breath, headache, confusion. Follow up with your family doctor.   Your blood pressure was high here today.  A lot of the common over-the-counter medications for cold symptoms can raise your blood pressure.  They do sell a specific medication line for people with high blood pressure.

## 2021-05-13 NOTE — ED Triage Notes (Signed)
Pt presents to ED POV. Pt c/o HA, cough, otalgia, diarrhea. Pt has hx of HA's

## 2021-05-13 NOTE — ED Provider Notes (Signed)
[facility] EMERGENCY DEPARTMENT Provider Note   CSN: [id number] Arrival date & time: 05/13/21  1825     History Chief Complaint  Patient presents with   Headache    Kelsey Rojas is a 25 y.o. female.  25 yo F with a chief complaint of headache and dizziness.  Going on for about 3 to 4 days.  Associate with cough and congestion.  No fevers or chills.  Having pain to both of her maxillary sinuses bilaterally.  Feels like pressure.  Also having pain in both of her ears.  Unsure of sick contacts.  No recent travel.  Has had a couple episodes of diarrhea.  Has had some transient abdominal discomfort.  The history is provided by the patient.  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   Past Surgical History:  Procedure Laterality Date   BREAST REDUCTION SURGERY Bilateral 10/12/2018   Procedure: BILATERAL MAMMARY REDUCTION  (BREAST);  Surgeon: Contogiannis, Chales Abrahams, MD;  Location: [facility] OR;  Service: Plastics;  Laterality: Bilateral;  BILATERAL MAMMARY REDUCTION  (BREAST)   UPPER GASTROINTESTINAL ENDOSCOPY       OB History   No obstetric history on file.     History reviewed. No pertinent family history.  Social History   Tobacco Use   Smoking status: Never   Smokeless tobacco: Never  Vaping Use   Vaping Use: Never used   Substance Use Topics   Alcohol use: Yes    Comment: occasionally   Drug use: No    Home Medications Prior to Admission medications   Medication Sig Start Date End Date Taking? Authorizing Provider  azithromycin (ZITHROMAX) 250 MG tablet Take 1 tablet (250 mg total) by mouth daily. Take first 2 tablets together, then 1 every day until finished. 05/13/21  Yes Melene Plan, DO  omeprazole (PRILOSEC) 20 MG capsule Take 20 mg by mouth daily. 11/29/19   [provider]  ondansetron (ZOFRAN ODT) 8 MG disintegrating tablet Take 1 tablet (8 mg total) by mouth every 8 (eight) hours as needed for nausea or vomiting. 02/03/20   Molpus, John, MD    Allergies    Penicillins, Ciprofloxacin, and Amoxicillin  Review of Systems   Review of Systems  Constitutional:  Negative for chills and fever.  HENT:  Positive for congestion, postnasal drip, sinus pressure and sinus pain. Negative for rhinorrhea.   Eyes:  Negative for redness and visual disturbance.  Respiratory:  Positive for cough. Negative for shortness of breath and wheezing.   Cardiovascular:  Negative for chest pain and palpitations.  Gastrointestinal:  Negative for nausea and vomiting.  Genitourinary:  Negative for dysuria and urgency.  Musculoskeletal:  Negative for arthralgias and myalgias.  Skin:  Negative for pallor and wound.  Neurological:  Positive for dizziness and headaches.   Physical Exam Updated Vital Signs BP Marland Kitchen)  175/111 (BP Location: Right Wrist)   Pulse 82   Temp 98.1 F (36.7 C) (Oral)   Resp 18   SpO2 100%   Physical Exam Vitals and nursing note reviewed.  Constitutional:      General: She is not in acute distress.    Appearance: She is well-developed. She is not diaphoretic.  HENT:     Head: Normocephalic and atraumatic.     Comments: Swollen turbinates, posterior nasal drip, Maxillary sinus ttp, tm with bilateral effusion serous without erythema or bulging.  Eyes:     Pupils: Pupils are equal, round,  and reactive to light.  Cardiovascular:     Rate and Rhythm: Normal rate and regular rhythm.     Heart sounds: No murmur heard.   No friction rub. No gallop.  Pulmonary:     Effort: Pulmonary effort is normal.     Breath sounds: No wheezing or rales.  Abdominal:     General: There is no distension.     Palpations: Abdomen is soft.     Tenderness: There is no abdominal tenderness.  Musculoskeletal:        General: No tenderness.     Cervical back: Normal range of motion and neck supple.  Skin:    General: Skin is warm and dry.  Neurological:     Mental Status: She is alert and oriented to person, place, and time.  Psychiatric:        Behavior: Behavior normal.    ED Results / Procedures / Treatments   Labs (all labs ordered are listed, but only abnormal results are displayed) Labs Reviewed - No data to display  EKG None  Radiology No results found.  Procedures Procedures   Medications Ordered in ED Medications - No data to display  ED Course  I have reviewed the triage vital signs and the nursing notes.  Pertinent labs & imaging results that were available during my care of the patient were reviewed by me and considered in my medical decision making (see chart for details).    MDM Rules/Calculators/A&P                          25 yo F with a chief complaints of headache and dizziness.  Having sinus pressure and coughing for about 3 to 4 days.  Clinically has a URI.  She is complaining of severe facial pain.  Will treat as sinusitis with antibiotics.  Has an amoxicillin allergy will prescribe a Z-Pak.  She was noted to be hypertensive here.  I counseled her on following up with her family doctor.  6:51 PM:  I have discussed the diagnosis/risks/treatment options with the patient and believe the pt to be eligible for discharge home to follow-up with PCP. We also discussed returning to the ED immediately if new or worsening sx occur. We discussed the sx which are most  concerning (e.g., sudden worsening pain, fever, inability to tolerate by mouth) that necessitate immediate return. Medications administered to the patient during their visit and any new prescriptions provided to the patient are listed below.  Medications given during this visit Medications - No data to display   The patient appears reasonably screen and/or stabilized for discharge and I doubt any other medical condition or other [facility] requiring further screening, evaluation, or treatment in the ED at this time prior to discharge.   Final Clinical Impression(s) / ED Diagnoses Final diagnoses:  Acute maxillary sinusitis, recurrence not specified  Hypertension, unspecified type    Rx / DC Orders ED Discharge Orders          Ordered    azithromycin (ZITHROMAX) 250 MG tablet  Daily        05/13/21 1845             Melene Plan, DO 05/13/21 1851

## 2021-11-05 ENCOUNTER — Encounter (HOSPITAL_BASED_OUTPATIENT_CLINIC_OR_DEPARTMENT_OTHER): Payer: Self-pay

## 2021-11-05 ENCOUNTER — Emergency Department (HOSPITAL_BASED_OUTPATIENT_CLINIC_OR_DEPARTMENT_OTHER): Payer: BC Managed Care – PPO

## 2021-11-05 ENCOUNTER — Emergency Department (HOSPITAL_BASED_OUTPATIENT_CLINIC_OR_DEPARTMENT_OTHER)
Admission: EM | Admit: 2021-11-05 | Discharge: 2021-11-05 | Disposition: A | Discharge status: Home / Self Care | Payer: BC Managed Care – PPO | Attending: Emergency Medicine | Admitting: Emergency Medicine

## 2021-11-05 ENCOUNTER — Other Ambulatory Visit: Payer: Self-pay

## 2021-11-05 DIAGNOSIS — M546 Pain in thoracic spine: Secondary | ICD-10-CM | POA: Diagnosis not present

## 2021-11-05 DIAGNOSIS — M549 Dorsalgia, unspecified: Secondary | ICD-10-CM

## 2021-11-05 DIAGNOSIS — R072 Precordial pain: Secondary | ICD-10-CM | POA: Insufficient documentation

## 2021-11-05 LAB — CBC WITH DIFFERENTIAL/PLATELET
Abs Immature Granulocytes: 0.01 10*3/uL (ref 0.00–0.07)
Basophils Absolute: 0 10*3/uL (ref 0.0–0.1)
Basophils Relative: 1 %
Eosinophils Absolute: 0.1 10*3/uL (ref 0.0–0.5)
Eosinophils Relative: 2 %
HCT: 42.6 % (ref 36.0–46.0)
Hemoglobin: 13.9 g/dL (ref 12.0–15.0)
Immature Granulocytes: 0 %
Lymphocytes Relative: 51 %
Lymphs Abs: 2.9 10*3/uL (ref 0.7–4.0)
MCH: 28.3 pg (ref 26.0–34.0)
MCHC: 32.6 g/dL (ref 30.0–36.0)
MCV: 86.8 fL (ref 80.0–100.0)
Monocytes Absolute: 0.4 10*3/uL (ref 0.1–1.0)
Monocytes Relative: 6 %
Neutro Abs: 2.3 10*3/uL (ref 1.7–7.7)
Neutrophils Relative %: 40 %
Platelets: 250 10*3/uL (ref 150–400)
RBC: 4.91 MIL/uL (ref 3.87–5.11)
RDW: 12.9 % (ref 11.5–15.5)
WBC: 5.7 10*3/uL (ref 4.0–10.5)
nRBC: 0 % (ref 0.0–0.2)

## 2021-11-05 LAB — COMPREHENSIVE METABOLIC PANEL
ALT: 20 U/L (ref 0–44)
AST: 19 U/L (ref 15–41)
Albumin: 4.2 g/dL (ref 3.5–5.0)
Alkaline Phosphatase: 72 U/L (ref 38–126)
Anion gap: 8 (ref 5–15)
BUN: 8 mg/dL (ref 6–20)
CO2: 27 mmol/L (ref 22–32)
Calcium: 9.2 mg/dL (ref 8.9–10.3)
Chloride: 105 mmol/L (ref 98–111)
Creatinine, Ser: 0.74 mg/dL (ref 0.44–1.00)
GFR, Estimated: >60 mL/min (ref >60)
Glucose, Bld: 101 mg/dL — ABNORMAL HIGH (ref 70–99)
Potassium: 3.5 mmol/L (ref 3.5–5.1)
Sodium: 140 mmol/L (ref 135–145)
Total Bilirubin: 0.5 mg/dL (ref 0.3–1.2)
Total Protein: 8.2 g/dL — ABNORMAL HIGH (ref 6.5–8.1)

## 2021-11-05 LAB — D-DIMER, QUANTITATIVE: D-Dimer, Quant: 0.38 ug/mL-FEU (ref 0.00–0.50)

## 2021-11-05 LAB — LACTIC ACID, PLASMA: Lactic Acid, Venous: 1 mmol/L (ref 0.5–1.9)

## 2021-11-05 LAB — HCG, SERUM, QUALITATIVE: Preg, Serum: NEGATIVE

## 2021-11-05 LAB — TROPONIN I (HIGH SENSITIVITY): Troponin I (High Sensitivity): 2 ng/L (ref <18)

## 2021-11-05 MED ORDER — IBUPROFEN 800 MG PO TABS: 800.0000 mg | ORAL_TABLET | Freq: Three times a day (TID) | ORAL | 0 refills | Status: DC | PRN

## 2021-11-05 MED ORDER — DICLOFENAC SODIUM 1 % EX GEL: 2.0000 g | Freq: Four times a day (QID) | CUTANEOUS | 0 refills | Status: DC | PRN

## 2021-11-05 NOTE — ED Triage Notes (Signed)
Yesterday at work she had pain in mid to lower spine that radiated to the right lateral torso Then eased off to being mild. This morning she awoke with severe pain in her back that radiates through to her chest and epigastric region. Pain has caused nausea Denies Vomiting or Diarrhea Denies shortness of breath

## 2021-11-05 NOTE — ED Provider Notes (Signed)
Emergency Department Provider Note   I have reviewed the triage vital signs and the nursing notes.   HISTORY  Chief Complaint Back Pain   HPI Kelsey Rojas is a 25 y.o. female with past medical history reviewed below presents emergency department for evaluation of sharp mid back pain radiating around to the chest.  Symptoms began yesterday.  She describes a stabbing pain in the middle of her back.  Denies any associated injury.  No associated numbness or weakness in the extremities.  No rash.  Pain radiates bilaterally underneath both breasts into the epigastric and lower chest area.  She denies any particular shortness of breath, fevers, chills.  No vomiting or diarrhea.  Pain is worse with touching in the area and movement.  No clear modifying factors.   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   Past Surgical History:  Procedure Laterality Date   BREAST REDUCTION SURGERY Bilateral 10/12/2018   Procedure: BILATERAL MAMMARY REDUCTION  (BREAST);  Surgeon: Contogiannis, Chales Abrahams, MD;  Location: [facility] OR;  Service: Plastics;  Laterality: Bilateral;  BILATERAL MAMMARY REDUCTION  (BREAST)   UPPER GASTROINTESTINAL ENDOSCOPY      Allergies Penicillins, Ciprofloxacin, and Amoxicillin  Family History  Problem Relation Age of Onset   Hypertension Mother    Cancer Father    Hypertension Father     Social History Social History   Tobacco Use   Smoking status: Never   Smokeless tobacco: Never  Vaping Use   Vaping Use: Never used  Substance Use Topics   Alcohol use: Yes    Comment: occasionally   Drug use: No    Review of Systems  Constitutional: No fever/chills Eyes: No visual changes. ENT: No sore throat. Cardiovascular: Positive chest pain. Respiratory: Denies shortness of breath. Gastrointestinal: No abdominal  pain.  No nausea, no vomiting.  No diarrhea.  No constipation. Genitourinary: Negative for dysuria. Musculoskeletal: Positive for back pain. Skin: Negative for rash. Neurological: Negative for headaches, focal weakness or numbness.  10-point ROS otherwise negative.  ____________________________________________   PHYSICAL EXAM:  VITAL SIGNS: ED Triage Vitals  Enc Vitals Group     BP 11/05/21 0925 (!) 159/112     Pulse Rate 11/05/21 0925 90     Resp 11/05/21 0925 10     Temp 11/05/21 0927 98.2 F (36.8 C)     Temp Source 11/05/21 0927 Oral     SpO2 11/05/21 0925 100 %     Weight 11/05/21 0935 275 lb (124.7 kg)     Height 11/05/21 0935 5\' 3"  (1.6 m)    Constitutional: Alert and oriented. Well appearing and in no acute distress. Eyes: Conjunctivae are normal.  Head: Atraumatic. Nose: No congestion/rhinnorhea. Mouth/Throat: Mucous membranes are moist.   Neck: No stridor.   Cardiovascular: Normal rate, regular rhythm. Good peripheral circulation. Grossly normal heart sounds.   Respiratory: Normal respiratory effort.  No retractions. Lungs CTAB. Gastrointestinal: Soft and nontender. No RUQ tenderness. Negative Murphy's sign. No distention.  Musculoskeletal: No lower extremity tenderness nor edema. No gross deformities of extremities.  Mild paraspinal tenderness in the thoracic region.  Mild tenderness around the anterior chest wall and lower third of the sternum. No crepitus.  Neurologic:  Normal speech and language. No gross focal neurologic deficits are appreciated.  Skin:  Skin is warm,  dry and intact. No rash noted.  ____________________________________________   LABS (all labs ordered are listed, but only abnormal results are displayed)  Labs Reviewed  COMPREHENSIVE METABOLIC PANEL - Abnormal; Notable for the following components:      Result Value   Glucose, Bld 101 (*)    Total Protein 8.2 (*)    All other components within normal limits  LACTIC ACID, PLASMA  CBC  WITH DIFFERENTIAL/PLATELET  HCG, SERUM, QUALITATIVE  D-DIMER, QUANTITATIVE  LACTIC ACID, PLASMA  TROPONIN I (HIGH SENSITIVITY)  TROPONIN I (HIGH SENSITIVITY)   ____________________________________________  EKG   EKG Interpretation  Date/Time:  Tuesday November 05 2021 09:24:25 EST Ventricular Rate:  85 PR Interval:  171 QRS Duration: 90 QT Interval:  358 QTC Calculation: 426 R Axis:   82 Text Interpretation: Sinus arrhythmia Confirmed by Nanda Quinton [phone]) on 11/05/2021 9:27:26 AM        ____________________________________________  RADIOLOGY  DG Chest Portable 1 View  Result Date: 11/05/2021 CLINICAL DATA:  Chest pain EXAM: PORTABLE CHEST 1 VIEW COMPARISON:  Chest x-ray 02/02/2020 FINDINGS: Somewhat limited due to body habitus and portable technique. Heart size and mediastinal contours are within normal limits. No suspicious pulmonary opacities identified. No pleural effusion or pneumothorax visualized. No acute osseous abnormality appreciated. IMPRESSION: No acute intrathoracic process identified. Electronically Signed   By: Ofilia Neas M.D.   On: 11/05/2021 10:05    ____________________________________________   PROCEDURES  Procedure(s) performed:   Procedures  None  ____________________________________________   INITIAL IMPRESSION / ASSESSMENT AND PLAN / ED COURSE  Pertinent labs & imaging results that were available during my care of the patient were reviewed by me and considered in my medical decision making (see chart for details).   Patient presents emergency department with sharp pain in the middle of her back rating around to the chest bilaterally.  Somewhat reproducible to palpation anteriorly.   Differential includes all life-threatening causes for chest pain. This includes but is not exclusive to acute coronary syndrome, aortic dissection, pulmonary embolism, cardiac tamponade, community-acquired pneumonia, pericarditis, musculoskeletal chest  wall pain, etc.  Overall given patient's age and risk factors I feel she is low risk for dissection or other vascular etiology of pain.  The pain is reproducible with palpation of the chest wall and back.  Seems musculoskeletal but will need labs including troponin.  I have added on a D-dimer.  Patient does have elevated BMI which would put her at increased risk in the sharp, sudden onset pain without injury does increase my suspicion somewhat for possible PE.   12:05 PM  On reassessment the patient is looking very well.  Her chest x-ray is clear and shows no infiltrate or pneumothorax.  Lab work is similarly reassuring.  Her pregnancy test is negative.  LFTs and bilirubin are normal.  No leukocytosis.  D-dimer is negative.  Exceedingly low suspicion for PE, dissection, ACS.  Troponin is normal.  EKG interpreted by me as above.   ____________________________________________  FINAL CLINICAL IMPRESSION(S) / ED DIAGNOSES  Final diagnoses:  Precordial chest pain  Mid back pain     NEW OUTPATIENT MEDICATIONS STARTED DURING THIS VISIT:  New Prescriptions   DICLOFENAC SODIUM (VOLTAREN) 1 % GEL    Apply 2 g topically 4 (four) times daily as needed.   IBUPROFEN (ADVIL) 800 MG TABLET    Take 1 tablet (800 mg total) by mouth every 8 (eight) hours as needed.    Note:  This document was prepared using Dragon voice  recognition software and may include unintentional dictation errors.  Nanda Quinton, MD, [facility]    Simrit Gohlke, [facility], MD 11/05/21 1210

## 2021-11-05 NOTE — Discharge Instructions (Signed)
He was seen in the emergency room today with chest and back discomfort.  Your lab work here is reassuring and did not show signs of blood clots in the lungs, heart attack, severe abdominal infection and requiring surgery.  I called in medications to help with your symptoms and will have you out of work for the next couple of days.  Please follow closely with your primary care doctor and return with any new or suddenly worsening symptoms.

## 2021-11-21 ENCOUNTER — Emergency Department (HOSPITAL_BASED_OUTPATIENT_CLINIC_OR_DEPARTMENT_OTHER)
Admission: EM | Admit: 2021-11-21 | Discharge: 2021-11-22 | Disposition: A | Discharge status: Home / Self Care | Payer: BC Managed Care – PPO | Attending: Emergency Medicine | Admitting: Emergency Medicine

## 2021-11-21 ENCOUNTER — Encounter (HOSPITAL_BASED_OUTPATIENT_CLINIC_OR_DEPARTMENT_OTHER): Payer: Self-pay | Admitting: *Deleted

## 2021-11-21 ENCOUNTER — Emergency Department (HOSPITAL_BASED_OUTPATIENT_CLINIC_OR_DEPARTMENT_OTHER): Payer: BC Managed Care – PPO

## 2021-11-21 ENCOUNTER — Other Ambulatory Visit: Payer: Self-pay

## 2021-11-21 DIAGNOSIS — R1011 Right upper quadrant pain: Secondary | ICD-10-CM | POA: Diagnosis not present

## 2021-11-21 DIAGNOSIS — R630 Anorexia: Secondary | ICD-10-CM | POA: Diagnosis not present

## 2021-11-21 DIAGNOSIS — R39198 Other difficulties with micturition: Secondary | ICD-10-CM | POA: Insufficient documentation

## 2021-11-21 DIAGNOSIS — R1031 Right lower quadrant pain: Secondary | ICD-10-CM | POA: Diagnosis not present

## 2021-11-21 LAB — CBC WITH DIFFERENTIAL/PLATELET
Abs Immature Granulocytes: 0.01 10*3/uL (ref 0.00–0.07)
Basophils Absolute: 0 10*3/uL (ref 0.0–0.1)
Basophils Relative: 0 %
Eosinophils Absolute: 0.1 10*3/uL (ref 0.0–0.5)
Eosinophils Relative: 1 %
HCT: 41.1 % (ref 36.0–46.0)
Hemoglobin: 13 g/dL (ref 12.0–15.0)
Immature Granulocytes: 0 %
Lymphocytes Relative: 48 %
Lymphs Abs: 4.1 10*3/uL — ABNORMAL HIGH (ref 0.7–4.0)
MCH: 27.7 pg (ref 26.0–34.0)
MCHC: 31.6 g/dL (ref 30.0–36.0)
MCV: 87.6 fL (ref 80.0–100.0)
Monocytes Absolute: 0.5 10*3/uL (ref 0.1–1.0)
Monocytes Relative: 5 %
Neutro Abs: 4.1 10*3/uL (ref 1.7–7.7)
Neutrophils Relative %: 46 %
Platelets: 264 10*3/uL (ref 150–400)
RBC: 4.69 MIL/uL (ref 3.87–5.11)
RDW: 13.2 % (ref 11.5–15.5)
WBC: 8.8 10*3/uL (ref 4.0–10.5)
nRBC: 0 % (ref 0.0–0.2)

## 2021-11-21 LAB — COMPREHENSIVE METABOLIC PANEL WITH GFR
ALT: 24 U/L (ref 0–44)
AST: 19 U/L (ref 15–41)
Albumin: 4.3 g/dL (ref 3.5–5.0)
Alkaline Phosphatase: 70 U/L (ref 38–126)
Anion gap: 10 (ref 5–15)
BUN: 9 mg/dL (ref 6–20)
CO2: 22 mmol/L (ref 22–32)
Calcium: 8.6 mg/dL — ABNORMAL LOW (ref 8.9–10.3)
Chloride: 105 mmol/L (ref 98–111)
Creatinine, Ser: 0.72 mg/dL (ref 0.44–1.00)
GFR, Estimated: >60 mL/min
Glucose, Bld: 81 mg/dL (ref 70–99)
Potassium: 3.2 mmol/L — ABNORMAL LOW (ref 3.5–5.1)
Sodium: 137 mmol/L (ref 135–145)
Total Bilirubin: 0.7 mg/dL (ref 0.3–1.2)
Total Protein: 7.9 g/dL (ref 6.5–8.1)

## 2021-11-21 LAB — LIPASE, BLOOD: Lipase: 49 U/L (ref 11–51)

## 2021-11-21 LAB — HCG, SERUM, QUALITATIVE: Preg, Serum: NEGATIVE

## 2021-11-21 MED ORDER — SODIUM CHLORIDE 0.9 % IV BOLUS
1000.0000 mL | Freq: Once | INTRAVENOUS | Status: AC
  Administered 2021-11-21: 1000 mL via INTRAVENOUS

## 2021-11-21 MED ORDER — IOHEXOL 300 MG/ML  SOLN
100.0000 mL | Freq: Once | INTRAMUSCULAR | Status: AC | PRN
  Administered 2021-11-21: 100 mL via INTRAVENOUS

## 2021-11-21 MED ORDER — ONDANSETRON HCL 4 MG PO TABS: 4.0000 mg | ORAL_TABLET | Freq: Four times a day (QID) | ORAL | 0 refills | Status: AC

## 2021-11-21 MED ORDER — HYDROCODONE-ACETAMINOPHEN 5-325 MG PO TABS
1.0000 | ORAL_TABLET | ORAL | 0 refills | Status: AC | PRN
Start: 2021-11-21 — End: 2021-11-24

## 2021-11-21 MED ORDER — POTASSIUM CHLORIDE 10 MEQ/100ML IV SOLN
10.0000 meq | Freq: Once | INTRAVENOUS | Status: AC
  Administered 2021-11-21: 10 meq via INTRAVENOUS
  Filled 2021-11-21: qty 100

## 2021-11-21 MED ORDER — ONDANSETRON HCL 4 MG/2ML IJ SOLN
4.0000 mg | Freq: Once | INTRAMUSCULAR | Status: AC
  Administered 2021-11-21: 4 mg via INTRAVENOUS
  Filled 2021-11-21: qty 2

## 2021-11-21 MED ORDER — MORPHINE SULFATE (PF) 4 MG/ML IV SOLN
4.0000 mg | Freq: Once | INTRAVENOUS | Status: DC
  Filled 2021-11-21: qty 1

## 2021-11-21 NOTE — Discharge Instructions (Signed)
We discussed the results of your laboratory findings along with your CT.  I have provided the referral to general surgery, please schedule an appointment for further evaluation of your gallbladder.  I have also provided pain medication for severe pain, please only take this when severe pain occurs.  In addition, I prescribed a short course of nausea medication to help with any nausea.  If you experience a fever, worsening vomiting, worsening pain you will need to return to the emergency department.

## 2021-11-21 NOTE — ED Provider Notes (Signed)
[facility] EMERGENCY DEPARTMENT Provider Note   CSN: [id number] Arrival date & time: 11/21/21  1930     History  Chief Complaint  Patient presents with   Abdominal Pain    Kelsey Rojas is a 26 y.o. female.  26 y.o female with no past medical history presents to the ED with a chief complaint of right upper quadrant pain has been ongoing for the past 2 weeks.  Previously evaluated in the ED and treated with muscle relaxers without any improvement in her symptoms.  On today's visit she reports she was also evaluated by her PCP on 11/06/2021, she was also given muscle relaxer but there was no improvement in her pain.  States the pain began worsening last night, describing it as an aching, sharp, stabbing pain to the right flank with radiation to her right abdomen.  She also has some anorexia, last meal was around noon today.  She is not endorsing any nausea.  Not had any urinary symptoms, no vomiting, no prior surgical intervention of her abdomen.  She is currently on her menstrual cycle, which has been ongoing for the past 2 weeks.  She is also endorsing some pressure with urination however no frequency, urgency.  He is sexually active, does not have any concern for STIs although her last sexual encounter was approximately 2 weeks ago.    The history is provided by the patient and medical records.  Abdominal Pain Pain location:  R flank Pain quality: aching, sharp and stabbing   Pain radiates to:  RLQ Pain severity:  Moderate Onset quality:  Gradual Duration:  2 weeks Timing:  Constant Progression:  Worsening Chronicity:  New Relieved by:  Nothing Worsened by:  Movement and position changes Associated symptoms: vaginal bleeding (on her menstrual cycle)   Associated symptoms: no chest pain, no chills, no cough, no fever, no nausea, no shortness of breath, no sore throat, no vaginal discharge and no vomiting       Home Medications Prior to Admission medications    Medication Sig Start Date End Date Taking? Authorizing Provider  diclofenac Sodium (VOLTAREN) 1 % GEL Apply 2 g topically 4 (four) times daily as needed. 11/05/21  Yes Long, Wonda Olds, MD  HYDROcodone-acetaminophen (NORCO/VICODIN) 5-325 MG tablet Take 1 tablet by mouth every 4 (four) hours as needed for up to 3 days. 11/21/21 11/24/21 Yes Mete Purdum, Beverley Fiedler, PA-C  omeprazole (PRILOSEC) 20 MG capsule Take 20 mg by mouth daily. 11/29/19  Yes [provider]  ondansetron (ZOFRAN) 4 MG tablet Take 1 tablet (4 mg total) by mouth every 6 (six) hours for 5 days. 11/21/21 11/26/21 Yes Kinslie Hove, Beverley Fiedler, PA-C  phentermine 30 MG capsule Take 30 mg by mouth every morning.   Yes [provider]  azithromycin (ZITHROMAX) 250 MG tablet Take 1 tablet (250 mg total) by mouth daily. Take first 2 tablets together, then 1 every day until finished. 05/13/21   Deno Etienne, DO  ibuprofen (ADVIL) 800 MG tablet Take 1 tablet (800 mg total) by mouth every 8 (eight) hours as needed. 11/05/21   Long, Wonda Olds, MD  ondansetron (ZOFRAN ODT) 8 MG disintegrating tablet Take 1 tablet (8 mg total) by mouth every 8 (eight) hours as needed for nausea or vomiting. 02/03/20   Molpus, John, MD      Allergies    Penicillins, Ciprofloxacin, and Amoxicillin    Review of Systems   Review of Systems  Constitutional:  Negative for chills and fever.  HENT:  Negative  for sore throat.   Respiratory:  Negative for cough and shortness of breath.   Cardiovascular:  Negative for chest pain.  Gastrointestinal:  Positive for abdominal pain. Negative for nausea and vomiting.  Genitourinary:  Positive for difficulty urinating and vaginal bleeding (on her menstrual cycle). Negative for decreased urine volume, flank pain, pelvic pain and vaginal discharge.  Musculoskeletal:  Negative for back pain.  Skin:  Negative for pallor and wound.  All other systems reviewed and are negative.  Physical Exam Updated Vital Signs BP 125/78    Pulse 77    Temp  98.2 F (36.8 C) (Oral)    Resp 17    Ht 5\' 3"  (1.6 m)    Wt 124.7 kg    SpO2 100%    BMI 48.70 kg/m  Physical Exam Vitals and nursing note reviewed.  Constitutional:      Appearance: She is well-developed.  HENT:     Head: Normocephalic and atraumatic.  Cardiovascular:     Rate and Rhythm: Normal rate.  Pulmonary:     Effort: Pulmonary effort is normal.     Breath sounds: No wheezing or rales.  Abdominal:     General: Abdomen is flat. Bowel sounds are decreased.     Palpations: Abdomen is soft.     Tenderness: There is abdominal tenderness in the right upper quadrant and right lower quadrant. There is right CVA tenderness.  Skin:    General: Skin is warm and dry.  Neurological:     Mental Status: She is alert.    ED Results / Procedures / Treatments   Labs (all labs ordered are listed, but only abnormal results are displayed) Labs Reviewed  CBC WITH DIFFERENTIAL/PLATELET - Abnormal; Notable for the following components:      Result Value   Lymphs Abs 4.1 (*)    All other components within normal limits  COMPREHENSIVE METABOLIC PANEL - Abnormal; Notable for the following components:   Potassium 3.2 (*)    Calcium 8.6 (*)    All other components within normal limits  LIPASE, BLOOD  HCG, SERUM, QUALITATIVE    EKG None  Radiology CT ABDOMEN PELVIS W CONTRAST  Result Date: 11/21/2021 CLINICAL DATA:  Acute abdominal pain.  Right upper quadrant pain. EXAM: CT ABDOMEN AND PELVIS WITH CONTRAST TECHNIQUE: Multidetector CT imaging of the abdomen and pelvis was performed using the standard protocol following bolus administration of intravenous contrast. CONTRAST:  138mL OMNIPAQUE IOHEXOL 300 MG/ML  SOLN COMPARISON:  None. FINDINGS: Lower chest: No acute abnormality. Hepatobiliary: No focal liver abnormality is seen. No gallstones, gallbladder wall thickening, gallstones or gallbladder sludge present. There is no biliary ductal dilatation. No pericholecystic inflammation. The liver  is within normal limits. Pancreas: Unremarkable. No pancreatic ductal dilatation or surrounding inflammatory changes. Spleen: Normal in size without focal abnormality. Adrenals/Urinary Tract: Adrenal glands are unremarkable. Kidneys are normal, without renal calculi, focal lesion, or hydronephrosis. There are rounded hypodensities in both kidneys which are too small to characterize, likely cysts. Otherwise, the kidneys, bladder, and adrenal glands are within normal limits. Stomach/Bowel: Stomach is within normal limits. Appendix appears normal. No evidence of bowel wall thickening, distention, or inflammatory changes. Vascular/Lymphatic: No significant vascular findings are present. No enlarged abdominal or pelvic lymph nodes. Reproductive: Uterus and bilateral adnexa are unremarkable. Other: No abdominal wall hernia or abnormality. No abdominopelvic ascites. Musculoskeletal: No acute or significant osseous findings. IMPRESSION: 1. Gallbladder sludge versus cholelithiasis. Electronically Signed   By: Ronney Asters M.D.   On:  11/21/2021 22:54    Procedures Procedures    Medications Ordered in ED Medications  potassium chloride 10 mEq in 100 mL IVPB (10 mEq Intravenous New Bag/Given 11/21/21 2315)  morphine 4 MG/ML injection 4 mg (4 mg Intravenous Not Given 11/21/21 2317)  sodium chloride 0.9 % bolus 1,000 mL (1,000 mLs Intravenous New Bag/Given 11/21/21 2300)  ondansetron (ZOFRAN) injection 4 mg (4 mg Intravenous Given 11/21/21 2256)  iohexol (OMNIPAQUE) 300 MG/ML solution 100 mL (100 mLs Intravenous Contrast Given 11/21/21 2238)    ED Course/ Medical Decision Making/ A&P Clinical Course as of 11/21/21 2317  Thu Nov 21, 2021  2316 Potassium(!): 3.2 IV replaced [JS]    Clinical Course User Index [JS] Janeece Fitting, PA-C                           Medical Decision Making  Patient presents to the ED with a chief complaint of right flank pain that is been ongoing for the past 2 weeks.  Given a prescription  for muscle relaxant without any improvement in her symptoms.  She read in the ED with stable vital signs, does have some some pressure with urination and currently on her menstrual cycle therefore hematuria has been present.  During my evaluation there is right CVA tenderness, significant tenderness along right upper quadrant, right lower quadrant, no prior surgical intervention of her abdomen.  She is sexually active but without any concerns for pregnancy or STI.  Clear to auscultation, no wheezing, rales.  CT abdomen and pelvis showed:  1. Gallbladder sludge versus cholelithiasis.  Given morphine along with bolus and potassium replacement.  However patient drove here, therefore cancel morphine.  I did discuss with her her results.  I have suspicion for cholelithiasis.  However patient is tolerating p.o., without any nausea, no vomiting.  LFTs are within normal limits, I did recommend that she follow-up with general surgeon on outpatient basis and she is agreeable for plan and treatment at this time.  I have provided a prescription for pain medication along with nausea medication to help with symptomatic treatment.  Patient is otherwise stable for discharge in stable condition.   Portions of this note were generated with [employer]. Dictation errors may occur despite best attempts at proofreading.   Final Clinical Impression(s) / ED Diagnoses Final diagnoses:  Right upper quadrant abdominal pain    Rx / DC Orders ED Discharge Orders          Ordered    HYDROcodone-acetaminophen (NORCO/VICODIN) 5-325 MG tablet  Every 4 hours PRN        11/21/21 2316    ondansetron (ZOFRAN) 4 MG tablet  Every 6 hours        11/21/21 2316              Janeece Fitting, PA-C 11/21/21 2318    Truddie Hidden, MD 11/26/21 [phone]

## 2021-11-21 NOTE — ED Notes (Signed)
Pt. Reports she took the plan B pill on Dec. 9 and has been bleeding since then.

## 2021-11-21 NOTE — ED Triage Notes (Signed)
Right upper quad pain. She was here 2 weeks ago for same and given muscle relaxants with no improvement.

## 2022-01-28 ENCOUNTER — Other Ambulatory Visit: Payer: Self-pay

## 2022-01-28 ENCOUNTER — Other Ambulatory Visit (HOSPITAL_COMMUNITY): Payer: BC Managed Care – PPO | Attending: Psychiatry | Admitting: Psychiatry

## 2022-01-28 DIAGNOSIS — F331 Major depressive disorder, recurrent, moderate: Secondary | ICD-10-CM | POA: Insufficient documentation

## 2022-01-28 DIAGNOSIS — F4310 Post-traumatic stress disorder, unspecified: Secondary | ICD-10-CM | POA: Insufficient documentation
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
## 2022-01-29 ENCOUNTER — Other Ambulatory Visit: Payer: Self-pay

## 2022-01-29 ENCOUNTER — Encounter (HOSPITAL_COMMUNITY): Payer: Self-pay | Admitting: Psychiatry

## 2022-01-29 ENCOUNTER — Other Ambulatory Visit (HOSPITAL_COMMUNITY): Payer: BC Managed Care – PPO | Admitting: Family

## 2022-01-29 DIAGNOSIS — F331 Major depressive disorder, recurrent, moderate: Secondary | ICD-10-CM | POA: Diagnosis present

## 2022-01-29 DIAGNOSIS — F4310 Post-traumatic stress disorder, unspecified: Secondary | ICD-10-CM

## 2022-01-29 NOTE — Plan of Care (Signed)
Pt is an active participant in her treatment plan. ?
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
## 2022-01-29 NOTE — Progress Notes (Signed)
Virtual Visit via Video Note ?  ?I connected with Kelsey Rojas, who prefers to go by ?Kelsey Rojas? on 01/29/22 at  9:00 AM EDT by a video enabled telemedicine application and verified that I am speaking with the correct person using two identifiers. ?  ?At orientation to the IOP program, Case Manager discussed the limitations of evaluation and management by telemedicine and the availability of in person appointments. The patient expressed understanding and agreed to proceed with virtual visits throughout the duration of the program. ?  ?Location:  ?Patient: Patient Home ?Provider: [facility] ?  ?History of Present Illness: ?MDD and PTSD  ?  ?Observations/Objective: ?Check In: Case Manager checked in with all participants to review discharge dates, insurance authorizations, work-related documents and needs from the treatment team regarding medications. Kelsey Rojas stated needs and engaged in discussion.  ?  ?Initial Therapeutic Activity: Counselor facilitated a check-in with Melvia, who prefers to go by ?Kelsey Rojas? to assess for safety, sobriety and medication compliance.  Counselor also inquired about Kelsey Rojas's current emotional ratings, as well as any significant changes in thoughts, feelings or behavior since previous check in.  Kelsey Rojas presented for session on time and was alert, oriented x5, with no evidence or self-report of active SI/HI or A/V H.  Kelsey Rojas reported that she is not currently on any medications and denied abuse of alcohol or illicit substances.  Kelsey Rojas reported scores of 3/10 for depression, 4/10 for anxiety, and 8/10 for anger/irritability.  Kelsey Rojas denied any recent panic attacks.  Kelsey Rojas reported that a recent success was turning in her tags for her car yesterday.  Kelsey Rojas reported that a recent struggle was having an outburst yesterday due to frustrations towards family members she lives with not pulling their weight with chores.  Kelsey Rojas reported that a goal for her is to clean and organize her closet today.        ? ?Second Therapeutic  Activity: Counselor introduced topic of anger management today.  Counselor virtually shared a handout with members on this subject featuring a variety of coping skills, and facilitated discussion on these approaches.  Examples included raising awareness of anger triggers, practicing deep breathing, keeping an anger log to better understand episodes, using diversion activities to distract oneself for 30 minutes, taking a time out when necessary, and being mindful of warning signs tied to thoughts or behavior.  Counselor inquired about which techniques group members have used before, what has proved to be helpful, what their unique warning signs might be, as well as what they will try out in the future to assist with de-escalation.  Intervention was effective, as evidenced by Kelsey Rojas successfully participating in discussion on subject and reporting that due to her job, she frequently deals with triggers that influence her anger, such as unfair treatment, rudeness, financial stress, jealousy, traffic jams, bullying, and feeling like her time is wasted.  Kelsey Rojas reported that her warning signs that anger is building include headaches, sleep disruption, and digestive issues, and she will typically compartmentalize anger until she can no longer hold it, and this comes out in verbal aggression towards those close to her, stating ?I feel better for a little bit, but then guilty because of how they feel?Kelsey Rojas reported that she is motivated to learn new coping skills to strengthen anger management plan, including meditation, deep breathing, journaling, and stating ?I want to learn to acknowledge me emotions, focus on my breathing, and other methods to stay calm?.   ? ?Assessment and Plan: ?Counselor recommends that  Kelsey Rojas remain in IOP treatment to better manage mental health symptoms, ensure stability and pursue completion of treatment plan goals. Counselor recommends adherence to crisis/safety plan, taking medications as prescribed,  and following up with medical professionals if any issues arise. ?  ?Follow Up Instructions: ?Counselor will send Webex link for next session. Kelsey Rojas was advised to call back or seek an in-person evaluation if the symptoms worsen or if the condition fails to improve as anticipated. ?  ?Collaboration of Care:   Medication Management AEB Hillery Jacks, NP  ?                                         Case Manager AEB Jeri Modena, [name]  ? ? ?Patient/Guardian was advised Release of Information must be obtained prior to any record release in order to collaborate their care with an outside provider. Patient/Guardian was advised if they have not already done so to contact the registration department to sign all necessary forms in order for [place] to release information regarding their care.  ? ?Consent: Patient/Guardian gives verbal consent for treatment and assignment of benefits for services provided during this visit. Patient/Guardian expressed understanding and agreed to proceed. ? ?I provided 180 minutes of non-face-to-face time during this encounter. ?  ?Noralee Stain, LCSW, LCAS ?01/29/22  ?

## 2022-01-30 ENCOUNTER — Other Ambulatory Visit (HOSPITAL_COMMUNITY): Payer: BC Managed Care – PPO | Admitting: Licensed Clinical Social Worker

## 2022-01-30 DIAGNOSIS — F331 Major depressive disorder, recurrent, moderate: Secondary | ICD-10-CM | POA: Diagnosis not present

## 2022-01-30 NOTE — Progress Notes (Signed)
Virtual Visit via Video Note ?  ?I connected with Kelsey Rojas, who prefers to go by ?Kelsey Rojas? on 01/30/22 at  9:00 AM EDT by a video enabled telemedicine application and verified that I am speaking with the correct person using two identifiers. ?  ?At orientation to the IOP program, Case Manager discussed the limitations of evaluation and management by telemedicine and the availability of in person appointments. The patient expressed understanding and agreed to proceed with virtual visits throughout the duration of the program. ?  ?Location:  ?Patient: Patient Home ?Provider: [facility] ?  ?History of Present Illness: ?MDD and PTSD ?  ?Observations/Objective: ?Check In: Case Manager checked in with all participants to review discharge dates, insurance authorizations, work-related documents and needs from the treatment team regarding medications. Kelsey Rojas stated needs and engaged in discussion.  ?  ?Initial Therapeutic Activity: Counselor facilitated a check-in with Roselma, who prefers to go by ?Kelsey Rojas? to assess for safety, sobriety and medication compliance.  Counselor also inquired about Kelsey Rojas's current emotional ratings, as well as any significant changes in thoughts, feelings or behavior since previous check in.  Kelsey Rojas presented for session on time and was alert, oriented x5, with no evidence or self-report of active SI/HI or A/V H.  Kelsey Rojas reported that she is still not on any medication at present, and denied use of alcohol or illicit substances.  Kelsey Rojas reported scores of 3/10 for depression, 5/10 for anxiety, and 2/10 for anger/irritability.  Kelsey Rojas denied any recent outbursts or panic attacks.  Kelsey Rojas reported that a recent success was opening up to a friend yesterday about her feelings after our last session, and then resting for 5 hours, stating ?I was emotionally drained and needed to rest?.   Kelsey Rojas reported that her goal today is to try and organize her closet.     ? ?Second Therapeutic Activity: Counselor introduced  Einar Grad, [employer] Pharmacist, to provide psychoeducation on topic of medication compliance with members today.  Jiles Garter provided psychoeducation on classes of medications such as antidepressants, antipsychotics, what symptoms they are intended to treat, and any side effects one might encounter while on a particular prescription.  Time was allowed for clients to ask any questions they might have of [facility] regarding this specialty.  Intervention was effective, as evidenced by Kelsey Rojas participating in discussion with speaker on the subject, inquiring about medications to address symptoms of ADD or ADHD she has experienced which have minimal side effects that could negatively affect mood.  Kelsey Rojas was receptive to feedback from pharmacist on how related medications are intended to address symptoms, and options that she could explore at followup appointment with her psychiatrist.   ? ?Third Therapeutic Intervention: Counselor introduced topic of building social support network today.  Counselor explained how this can be defined as having a having a group of healthy people in one's life you can talk to, spend time with, and get help from to improve both mental and physical health.  Counselor noted that some barriers can make it difficult to connect with other people, including the presence of anxiety or depression, or moving to an unfamiliar area.  Group members were asked to assess the current state of their support network, and identify ways that this could be improved.  Tips were given on how to address previously noted barriers, such as strengthening social skills, using relaxation techniques to reduce anxiety, scheduling social time each week, and/or exploring social events nearby which could increase chances of meeting new supports.  Members were  also encouraged to consider getting closer to people they already know through suggestions such as outreaching someone by text, email or phone call if they haven't spoken in awhile,  doing something nice for a friend/family member unexpectedly, and/or inviting someone over for a game/movie/dinner night.  Intervention was effective, as evidenced by Kelsey Rojas actively participating in discussion on the subject, and reporting that although she never has trouble meeting new people each day due to her welcoming demeanor, she could use more peers in her network that she can discuss serious subjects like mental health issues with, stating ?I think I could use some more support.  So far I've started looking for people that have the same trauma as me, but I don't initially bring up topics until they do?Kelsey Rojas reported that she would plan to outreach [facility] for entry into their community mental health support groups, and revisit a church nearby in order to make new meaningful connections with people that share similar values and perspective.   ? ?Assessment and Plan: ?Counselor recommends that Kelsey Rojas remain in IOP treatment to better manage mental health symptoms, ensure stability and pursue completion of treatment plan goals. Counselor recommends adherence to crisis/safety plan, taking medications as prescribed, and following up with medical professionals if any issues arise. ?  ?Follow Up Instructions: ?Counselor will send Webex link for next session. Kelsey Rojas was advised to call back or seek an in-person evaluation if the symptoms worsen or if the condition fails to improve as anticipated. ?  ?Collaboration of Care:   Medication Management AEB Ricky Ala, NP  ?                                         Case Manager AEB Dellia Nims, [name]  ? ? ?Patient/Guardian was advised Release of Information must be obtained prior to any record release in order to collaborate their care with an outside provider. Patient/Guardian was advised if they have not already done so to contact the registration department to sign all necessary forms in order for [place] to release information regarding their care.  ? ?Consent:  Patient/Guardian gives verbal consent for treatment and assignment of benefits for services provided during this visit. Patient/Guardian expressed understanding and agreed to proceed. ? ?I provided 180 minutes of non-face-to-face time during this encounter. ?  ?Shade Flood, LCSW, LCAS ?01/30/22  ?

## 2022-01-31 ENCOUNTER — Telehealth (HOSPITAL_COMMUNITY): Payer: Self-pay | Admitting: Psychiatry

## 2022-01-31 ENCOUNTER — Ambulatory Visit (HOSPITAL_COMMUNITY): Payer: Self-pay | Admitting: Psychiatry

## 2022-02-03 ENCOUNTER — Other Ambulatory Visit: Payer: Self-pay

## 2022-02-03 ENCOUNTER — Other Ambulatory Visit (HOSPITAL_COMMUNITY): Payer: BC Managed Care – PPO | Admitting: Psychiatry

## 2022-02-03 DIAGNOSIS — F4310 Post-traumatic stress disorder, unspecified: Secondary | ICD-10-CM

## 2022-02-03 DIAGNOSIS — F331 Major depressive disorder, recurrent, moderate: Secondary | ICD-10-CM
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
## 2022-02-04 ENCOUNTER — Other Ambulatory Visit (HOSPITAL_COMMUNITY): Payer: BC Managed Care – PPO | Admitting: Licensed Clinical Social Worker

## 2022-02-04 ENCOUNTER — Other Ambulatory Visit: Payer: Self-pay

## 2022-02-04 DIAGNOSIS — F331 Major depressive disorder, recurrent, moderate: Secondary | ICD-10-CM

## 2022-02-04 DIAGNOSIS — F4310 Post-traumatic stress disorder, unspecified: Secondary | ICD-10-CM

## 2022-02-04 NOTE — Progress Notes (Signed)
Virtual Visit via Video Note ?  ?I connected with Kelsey Rojas, who prefers to go by ?Kelsey Rojas? on 02/04/22 at  9:00 AM EDT by a video enabled telemedicine application and verified that I am speaking with the correct person using two identifiers. ?  ?At orientation to the IOP program, Case Manager discussed the limitations of evaluation and management by telemedicine and the availability of in person appointments. The patient expressed understanding and agreed to proceed with virtual visits throughout the duration of the program. ?  ?Location:  ?Patient: Patient Home ?Provider: [facility] ?  ?History of Present Illness: ?MDD and PTSD ?  ?Observations/Objective: ?Check In: Case Manager checked in with all participants to review discharge dates, insurance authorizations, work-related documents and needs from the treatment team regarding medications. Kelsey Rojas stated needs and engaged in discussion.  ?  ?Initial Therapeutic Activity: Counselor facilitated a check-in with Chrissette, who prefers to go by ?Kelsey Rojas? to assess for safety, sobriety and medication compliance.  Counselor also inquired about Kelsey Rojas's current emotional ratings, as well as any significant changes in thoughts, feelings or behavior since previous check in.  Kelsey Rojas presented for session on time and was alert, oriented x5, with no evidence or self-report of active SI/HI or A/V H.  Kelsey Rojas reported compliance with medication and denied use of alcohol or illicit substances.  Kelsey Rojas reported scores of 5/10 for depression, 5/10 for anxiety, and 5/10 for anger/irritability.  Kelsey Rojas denied any recent outbursts or panic attacks.  Kelsey Rojas reported that a recent success was getting her nails done for self-care yesterday.  Kelsey Rojas reported that a one issue that came up was having to wait a very long time for her nail appointment, which greatly irritated her, but helped her build patience.  Kelsey Rojas also reported that she and her partner split up yesterday, stating ?It is what it is?Kelsey Rojas  reported that her goal today will be to rearrange her room to stay productive.    ? ?Second Therapeutic Activity: Counselor introduced [employer], [facility] Chaplain to provide psychoeducation on topic of Grief and Loss with members today.  Estill Bamberg began discussion by checking in with the group about their baseline mood today, general thoughts on what grief means to them and how it has affected them personally in the past.  Estill Bamberg provided information on how the process of grief/loss can differ depending upon one's unique culture, and categories of loss one could experience (i.e. loss of a person, animal, relationship, job, identity, etc).  Estill Bamberg encouraged members to be mindful of how pervasive loss can be, and how to recognize signs which could indicate that this is having an impact on one's overall mental health and wellbeing.  Intervention was effective, as evidenced by Kelsey Rojas participating in discussion with speaker on the subject, reporting that she has been reflecting on the passing of her grandmother recently, as the anniversary of her passing is nearing, and this can be a difficult time for her.  Kelsey Rojas was receptive to feedback from chaplain on how to cope with grief triggers during this difficult time.  Kelsey Rojas reported that something which brings her joy and helps her cope with loss is spending time with the 10 dogs in her home, noting that many are still puppies and can be a positive distraction to avoid excessive rumination.    ? ?Third Therapeutic Activity: Counselor introduced topic of grounding skills today.  Counselor defined these as simple strategies one can use to help detach from difficult thoughts or feelings temporarily  by focusing on something else.  Counselor noted that grounding will not solve the problem at hand, but can provide the practitioner with time to regain control over their thoughts and/or feelings and prevent the situation from getting worse (i.e. interrupting a panic attack).   Counselor divided these into three categories (mental, physical, and soothing) and then provided examples of each which group members could practice during session.  Some of these included describing one's environment in detail or playing a categories game with oneself for mental category, taking a hot bath/shower, stretching, or carrying a grounding object for physical category, and saying kind statements, or visualizing people one cares about for soothing category.  Counselor inquired about which techniques members have used with success in the past, or will commit to learning, practicing, and applying now to improve coping abilities.  Intervention was effective, as evidenced by Kelsey Rojas participating in discussion on the subject, trying out several of the techniques during session, and expressing interest in adding several to her available coping skills, such as describing her environment in detail, describing an everyday activity in detail, using her imagination to visualize being at a relaxing campfire, reading a thriller novel that is engrossing, thinking of something funny, running warm water on her hands, gripping a chair tightly, chewing gum, doing a yoga routine, or focusing on her breathing pattern to calm down.   ? ?Assessment and Plan: ?Counselor recommends that Kelsey Rojas remain in IOP treatment to better manage mental health symptoms, ensure stability and pursue completion of treatment plan goals. Counselor recommends adherence to crisis/safety plan, taking medications as prescribed, and following up with medical professionals if any issues arise. ?  ?Follow Up Instructions: ?Counselor will send Webex link for next session. Kelsey Rojas was advised to call back or seek an in-person evaluation if the symptoms worsen or if the condition fails to improve as anticipated. ?  ?Collaboration of Care:   Medication Management AEB Ricky Ala, NP  ?                                         Case Manager AEB Dellia Nims, [name]   ? ? ?Patient/Guardian was advised Release of Information must be obtained prior to any record release in order to collaborate their care with an outside provider. Patient/Guardian was advised if they have not already done so to contact the registration department to sign all necessary forms in order for [place] to release information regarding their care.  ? ?Consent: Patient/Guardian gives verbal consent for treatment and assignment of benefits for services provided during this visit. Patient/Guardian expressed understanding and agreed to proceed. ? ?I provided 180 minutes of non-face-to-face time during this encounter. ?  ?Shade Flood, LCSW, LCAS ?02/04/22  ?

## 2022-02-05 ENCOUNTER — Other Ambulatory Visit (HOSPITAL_COMMUNITY): Payer: BC Managed Care – PPO | Admitting: Licensed Clinical Social Worker

## 2022-02-05 ENCOUNTER — Other Ambulatory Visit: Payer: Self-pay

## 2022-02-05 DIAGNOSIS — F331 Major depressive disorder, recurrent, moderate: Secondary | ICD-10-CM

## 2022-02-05 DIAGNOSIS — F4310 Post-traumatic stress disorder, unspecified: Secondary | ICD-10-CM

## 2022-02-05 NOTE — Progress Notes (Signed)
Virtual Visit via Video Note ?  ?I connected with Kelsey Rojas, who prefers to go by ?Kelsey Rojas? on 02/05/22 at  9:00 AM EDT by a video enabled telemedicine application and verified that I am speaking with the correct person using two identifiers. ?  ?At orientation to the IOP program, Case Manager discussed the limitations of evaluation and management by telemedicine and the availability of in person appointments. The patient expressed understanding and agreed to proceed with virtual visits throughout the duration of the program. ?  ?Location:  ?Patient: Patient Home ?Provider: [facility] ?  ?History of Present Illness: ?MDD and PTSD ?  ?Observations/Objective: ?Check In: Case Manager checked in with all participants to review discharge dates, insurance authorizations, work-related documents and needs from the treatment team regarding medications. Kelsey Rojas stated needs and engaged in discussion.  ?  ?Initial Therapeutic Activity: Counselor facilitated a check-in with Kelsey Rojas, who prefers to go by ?Kelsey Rojas? to assess for safety, sobriety and medication compliance.  Counselor also inquired about Kelsey Rojas's current emotional ratings, as well as any significant changes in thoughts, feelings or behavior since previous check in.  Kelsey Rojas presented for session on time and was alert, oriented x5, with no evidence or self-report of active SI/HI or A/V H.  Kelsey Rojas reported that she is not on any medication.  She denied use of alcohol or illicit substances.  Kelsey Rojas reported scores of 0/10 for depression, 2/10 for anxiety, and 0/10 for anger/irritability.  Kelsey Rojas denied any recent outbursts or panic attacks.  Kelsey Rojas reported that a recent success was taking time to rest yesterday, spend time with friends, and then organize some furniture, stating ?I think the change of scenery was good for me?Kelsey Rojas denied any present struggles.  She reported that her goal today is to wash her car and donate some clothes.      ? ?Second Therapeutic Activity: Counselor  introduced Kelsey Rojas, [employer] Pharmacist, to provide psychoeducation on topic of medication compliance with members today.  Kelsey Rojas provided psychoeducation on classes of medications such as antidepressants, antipsychotics, what symptoms they are intended to treat, and any side effects one might encounter while on a particular prescription.  Time was allowed for clients to ask any questions they might have of [facility] regarding this specialty.  Intervention effectiveness could not be measured, as Kelsey Rojas did not engage in discussion on subject.   ? ?Third Therapeutic Activity: Psycho-educational portion of group was co-facilitated by wellness director (Kelsey Stall, MS, MPH, [facility]) focused on self-care in daily life. Facilitator and group members discussed presented materials regarding importance of sleep, diet, and exercise. Group members discussed any changes they are willing to make to improve an area of self-care in their lives (physical, psychological, emotional, spiritual, relationship, professional) to improve overall mental health as they continue with treatment.  Intervention was effective, as evidenced by Kelsey Rojas participating in discussion with speaker on the subject, reporting that she recognizes the importance of getting regular exercise to ensure wellness, and has a group of friends she works out with regularly for accountability in sticking to weight lifting and cardio exercises a few times per week.  Kelsey Rojas reported that she would also work to implement recommended sleep hygiene techniques in order to boost average sleep from 2-3 hours most nights to 8.   ? ?Assessment and Plan: ?Counselor recommends that Kelsey Rojas remain in IOP treatment to better manage mental health symptoms, ensure stability and pursue completion of treatment plan goals. Counselor recommends adherence to crisis/safety plan, taking medications as  prescribed, and following up with medical professionals if any issues arise. ?  ?Follow Up  Instructions: ?Counselor will send Webex link for next session. Kelsey Rojas was advised to call back or seek an in-person evaluation if the symptoms worsen or if the condition fails to improve as anticipated. ?  ?Collaboration of Care:   Medication Management AEB Hillery Jacks, NP  ?                                         Case Manager AEB Jeri Modena, [name]  ? ? ?Patient/Guardian was advised Release of Information must be obtained prior to any record release in order to collaborate their care with an outside provider. Patient/Guardian was advised if they have not already done so to contact the registration department to sign all necessary forms in order for [place] to release information regarding their care.  ? ?Consent: Patient/Guardian gives verbal consent for treatment and assignment of benefits for services provided during this visit. Patient/Guardian expressed understanding and agreed to proceed. ? ?I provided 180 minutes of non-face-to-face time during this encounter. ?  ?Noralee Stain, LCSW, LCAS ?02/05/22  ?

## 2022-02-06 ENCOUNTER — Other Ambulatory Visit: Payer: Self-pay

## 2022-02-06 ENCOUNTER — Other Ambulatory Visit (HOSPITAL_COMMUNITY): Payer: BC Managed Care – PPO | Admitting: Licensed Clinical Social Worker

## 2022-02-06 DIAGNOSIS — F331 Major depressive disorder, recurrent, moderate: Secondary | ICD-10-CM

## 2022-02-06 DIAGNOSIS — F4310 Post-traumatic stress disorder, unspecified: Secondary | ICD-10-CM

## 2022-02-06 NOTE — Progress Notes (Signed)
Virtual Visit via Video Note ?  ?I connected with Kelsey Rojas, who prefers to go by ?Kelsey Rojas? on 02/06/22 at  9:00 AM EDT by a video enabled telemedicine application and verified that I am speaking with the correct person using two identifiers. ?  ?At orientation to the IOP program, Case Manager discussed the limitations of evaluation and management by telemedicine and the availability of in person appointments. The patient expressed understanding and agreed to proceed with virtual visits throughout the duration of the program. ?  ?Location:  ?Patient: Patient Home ?Provider: [facility] [place] [facility] ?  ?History of Present Illness: ?MDD and PTSD ?  ?Observations/Objective: ?Check In: Case Manager checked in with all participants to review discharge dates, insurance authorizations, work-related documents and needs from the treatment team regarding medications. Kelsey Rojas stated needs and engaged in discussion.  ?  ?Initial Therapeutic Activity: Counselor facilitated a check-in with Onolee, who prefers to go by ?Kelsey Rojas? to assess for safety, sobriety and medication compliance.  Counselor also inquired about Kelsey Rojas's current emotional ratings, as well as any significant changes in thoughts, feelings or behavior since previous check in.  Kelsey Rojas presented for session on time and was alert, oriented x5, with no evidence or self-report of active SI/HI or A/V H.  Kelsey Rojas reported that she remains off any medications.  She denied use of alcohol or illicit substances.  Kelsey Rojas reported scores of 0/10 for depression, 3/10 for anxiety, and 0/10 for anger/irritability.  Kelsey Rojas denied any recent outbursts or panic attacks.  Kelsey Rojas reported that a recent success was thinking about making blankets again as part of her developing self-care routine.   Kelsey Rojas denied any new major struggles.  Kelsey Rojas reported that her goal today is to wash her car since the weather outside will be clear and warm, and then work on a new blanket.     ? ?Second Therapeutic Activity: Counselor introduced  topic of self-care today.  Counselor explained how this can be defined as the things one does to maintain good health and improve well-being.  Counselor provided members with a self-care assessment form to complete.  This handout featured various sub-categories of self-care, including physical, psychological/emotional, social, spiritual, and professional.  Members were asked to rank their engagement in the activities listed for each dimension on a scale of 1-3, with 1 indicating 'Poor', 2 indicating '[place]', and 3 indicating 'Well'.  Counselor invited members to share results of their assessment, and inquired about which areas of self-care they are doing well in, as well as areas that require attention, and how they plan to begin addressing this during treatment. Intervention was effective, as evidenced by Kelsey Rojas successfully completing initial 2 sections of assessment and actively engaging in discussion on subject, reporting that she is excelling in areas such as maintaining personal hygiene, wearing clothes that make her feel good about herself, doing comforting things, and finding reasons to laugh, but would benefit from focusing more on areas such as getting enough sleep, exercising, eating regularly, resting when sick, recognizing her own strengths and achievements, taking time off from work, participating in hobbies, getting away from distractions, expressing feelings in healthy ways, going on vacations and daytrips.  Kelsey Rojas reported that she would work to improve self-care deficits by getting back into a normal sleep routine, eat regular meals throughout the day to get back into a routine that will fuel workouts properly, using support groups as a chance to open up about feelings and stressors, learning to set work boundaries to ensure she takes leave  time more often, plan to go to the beach next week for vacation, and seek a trauma therapist to work with after discharge from group.   ? ?Assessment and  Plan: ?Counselor recommends that Kelsey Rojas remain in IOP treatment to better manage mental health symptoms, ensure stability and pursue completion of treatment plan goals. Counselor recommends adherence to crisis/safety plan, taking medications as prescribed, and following up with medical professionals if any issues arise. ?  ?Follow Up Instructions: ?Counselor will send Webex link for next session. Kelsey Rojas was advised to call back or seek an in-person evaluation if the symptoms worsen or if the condition fails to improve as anticipated. ?  ?Collaboration of Care:   Medication Management AEB Ricky Ala, NP  ?                                         Case Manager AEB Dellia Nims, [name]  ? ? ?Patient/Guardian was advised Release of Information must be obtained prior to any record release in order to collaborate their care with an outside provider. Patient/Guardian was advised if they have not already done so to contact the registration department to sign all necessary forms in order for [place] to release information regarding their care.  ? ?Consent: Patient/Guardian gives verbal consent for treatment and assignment of benefits for services provided during this visit. Patient/Guardian expressed understanding and agreed to proceed. ? ?I provided 180 minutes of non-face-to-face time during this encounter. ?  ?Shade Flood, LCSW, LCAS ?02/06/22  ?

## 2022-02-07 ENCOUNTER — Other Ambulatory Visit: Payer: Self-pay

## 2022-02-07 ENCOUNTER — Other Ambulatory Visit (HOSPITAL_COMMUNITY): Payer: BC Managed Care – PPO | Admitting: Psychiatry

## 2022-02-07 DIAGNOSIS — F4310 Post-traumatic stress disorder, unspecified: Secondary | ICD-10-CM

## 2022-02-07 DIAGNOSIS — F331 Major depressive disorder, recurrent, moderate: Secondary | ICD-10-CM | POA: Diagnosis not present

## 2022-02-07 NOTE — Progress Notes (Signed)
Virtual Visit via Video Note ?  ?I connected with Kelsey Rojas, who prefers to go by ?Kelsey Rojas? on 02/07/22 at  9:00 AM EDT by a video enabled telemedicine application and verified that I am speaking with the correct person using two identifiers. ?  ?At orientation to the IOP program, Case Manager discussed the limitations of evaluation and management by telemedicine and the availability of in person appointments. The patient expressed understanding and agreed to proceed with virtual visits throughout the duration of the program. ?  ?Location:  ?Patient: Patient Home ?Provider: [facility] ?  ?History of Present Illness: ?MDD and PTSD ?  ?Observations/Objective: ?Check In: Case Manager checked in with all participants to review discharge dates, insurance authorizations, work-related documents and needs from the treatment team regarding medications. Kelsey Rojas stated needs and engaged in discussion.  ?  ?Initial Therapeutic Activity: Counselor facilitated a check-in with Kelsey Rojas, who prefers to go by ?Kelsey Rojas? to assess for safety, sobriety and medication compliance.  Counselor also inquired about Kelsey Rojas's current emotional ratings, as well as any significant changes in thoughts, feelings or behavior since previous check in.  Kelsey Rojas presented for session on time and was alert, oriented x5, with no evidence or self-report of active SI/HI or A/V H.  Kelsey Rojas reported that she remains off any medication at present.  She denied use of alcohol or illicit substances.  Kelsey Rojas reported scores of 0/10 for depression, 4/10 for anxiety, and 2/10 for anger/irritability.  Kelsey Rojas denied any recent outbursts or panic attacks.  Kelsey Rojas reported that a recent success was getting her car washed yesterday.  Kelsey Rojas reported that a recent struggle was not getting any sleep last night, stating ?I'm kinda shaky today?.  She reported that her goal today is to take a nap, and try to get back into a normal sleep pattern over the weekend.   ? ?Second Therapeutic Activity: Counselor  invited members to participate in peaceful place guided imagery activity today.  Counselor explained how this is a powerful visualization tool which can aid in reducing stress while increasing sense of calm, control, and awareness if practiced regularly.  Counselor informed members beforehand that if they became uncomfortable at any point during activity, they could stop and open their eyes.  Counselor invited members to get comfortable, achieve a relaxing breathing rhythm, close their eyes, and then guided them through process of creating a 'peaceful place' which filled them with safety and calm.  Counselor encouraged members to include sensory details involving vision, sound, touch, smell, and taste which they considered pleasant to enhance experience.  Counselor invited members to share their opinion on the activity, including whether they were able to imagine a specific place, what details stood out to them, and how this made them feel during and after.  Intervention was effective, as evidenced by Kelsey Rojas successfully participating in activity, and reporting that she was able to focus on the exercise, and imagined walking on a hiking trail, which eventually led to a beach setting she could relax on.  Kelsey Rojas reported that she almost fell asleep because of how calming this was, and found various sensory details to be especially pleasant, such as the feeling of the sun on her skin, sound of birds chirping, and sight of palm trees.  Kelsey Rojas reported that she would practice this as part of her developing self-care routine.   ? ?Assessment and Plan: ?Counselor recommends that Kelsey Rojas remain in IOP treatment to better manage mental health symptoms, ensure stability and pursue completion of treatment  plan goals. Counselor recommends adherence to crisis/safety plan, taking medications as prescribed, and following up with medical professionals if any issues arise. ?  ?Follow Up Instructions: ?Counselor will send Webex link for next  session. Kelsey Rojas was advised to call back or seek an in-person evaluation if the symptoms worsen or if the condition fails to improve as anticipated. ?  ?Collaboration of Care:   Medication Management AEB Hillery Jacks, NP  ?                                         Case Manager AEB Jeri Modena, [name]  ? ? ?Patient/Guardian was advised Release of Information must be obtained prior to any record release in order to collaborate their care with an outside provider. Patient/Guardian was advised if they have not already done so to contact the registration department to sign all necessary forms in order for [place] to release information regarding their care.  ? ?Consent: Patient/Guardian gives verbal consent for treatment and assignment of benefits for services provided during this visit. Patient/Guardian expressed understanding and agreed to proceed. ? ?I provided 165 minutes of non-face-to-face time during this encounter. ?  ?Noralee Stain, LCSW, LCAS ?02/07/22  ?

## 2022-02-10 ENCOUNTER — Ambulatory Visit (HOSPITAL_COMMUNITY): Payer: Self-pay

## 2022-02-10 ENCOUNTER — Telehealth (HOSPITAL_COMMUNITY): Payer: Self-pay | Admitting: Psychiatry

## 2022-02-11 ENCOUNTER — Other Ambulatory Visit: Payer: Self-pay

## 2022-02-11 ENCOUNTER — Other Ambulatory Visit (HOSPITAL_COMMUNITY): Payer: BC Managed Care – PPO | Admitting: Licensed Clinical Social Worker

## 2022-02-11 DIAGNOSIS — F331 Major depressive disorder, recurrent, moderate: Secondary | ICD-10-CM

## 2022-02-11 DIAGNOSIS — F4310 Post-traumatic stress disorder, unspecified: Secondary | ICD-10-CM

## 2022-02-11 NOTE — Progress Notes (Signed)
Virtual Visit via Video Note ?  ?I connected with Kelsey Rojas, who prefers to go by ?Kelsey Rojas? on 02/11/22 at  9:00 AM EDT by a video enabled telemedicine application and verified that I am speaking with the correct person using two identifiers. ?  ?At orientation to the IOP program, Case Manager discussed the limitations of evaluation and management by telemedicine and the availability of in person appointments. The patient expressed understanding and agreed to proceed with virtual visits throughout the duration of the program. ?  ?Location:  ?Patient: Patient Home ?Provider: [facility] [place] [facility] ?  ?History of Present Illness: ?MDD and PTSD ?  ?Observations/Objective: ?Check In: Case Manager checked in with all participants to review discharge dates, insurance authorizations, work-related documents and needs from the treatment team regarding medications. Kelsey Rojas stated needs and engaged in discussion.  ?  ?Initial Therapeutic Activity: Counselor facilitated a check-in with Andreal, who prefers to go by ?Kelsey Rojas? to assess for safety, sobriety and medication compliance.  Counselor also inquired about Kelsey Rojas's current emotional ratings, as well as any significant changes in thoughts, feelings or behavior since previous check in.  Kelsey Rojas presented for session on time and was alert, oriented x5, with no evidence or self-report of active SI/HI or A/V H.  Kelsey Rojas denied being on any behavioral medications.  Kelsey Rojas denied use of alcohol or illicit substances.  Kelsey Rojas reported scores of 5/10 for depression, 5/10 for anxiety, and 7/10 for anger/irritability.  Kelsey Rojas denied any recent outbursts or panic attacks.  Kelsey Rojas reported that a recent success was going to [employer] in [place] for self-care the other day, as well as karaoke afterward with friends.  Kelsey Rojas reported that a recent struggle has been continuing to deal with sleep issues, stating ?After IOP on Friday I didn't go to sleep until Saturday night?Kelsey Rojas reported that she crashed yesterday due  to fatigue and missed group as a result.  Kelsey Rojas reported that she has an appointment with her doctor on the 4th and she will plan to ask about medications that might help.    ? ?Second Therapeutic Activity: Counselor introduced [employer], [facility] to provide psychoeducation on topic of Grief and Loss with members today.  Estill Bamberg began discussion by checking in with the group about their baseline mood today, general thoughts on what grief means to them and how it has affected them personally in the past.  Estill Bamberg provided information on how the process of grief/loss can differ depending upon one's unique culture, and categories of loss one could experience (i.e. loss of a person, animal, relationship, job, identity, etc).  Estill Bamberg encouraged members to be mindful of how pervasive loss can be, and how to recognize signs which could indicate that this is having an impact on one's overall mental health and wellbeing.  Intervention was effective, as evidenced by Kelsey Rojas participating in discussion with speaker on the subject, reporting that one loss she is currently processing is the end of a meaningful friendship, stating ?It has changed my whole routine?Kelsey Rojas reported that at times she feels angry too, and little things can trigger unexpected outbursts, stating ?Its not healthy and people at work notice?Kelsey Rojas reported that one things which helps her cope with loss is planning for safe treats, such as a favorite chocolate candy or spending quality time with supportive friends.   ? ?Third Therapeutic Activity: Counselor introduced topic of assertive communication today.  Counselor shared various handouts with members virtually in group to read along  with on the subject.  These handouts defined assertive communication as a communication style in which a person stands up for their own needs and wants, while also taking into consideration the needs and wants of others, without behaving in a passive or aggressive way.   Traits of assertive communicators were highlighted such as using appropriate speaking volume, maintaining eye contact, using confident language, and avoiding interruption.  Members were also provided with tips on how to improve communication, including respecting oneself, expressing thoughts and feelings calmly, and saying ?No? when necessary.  Members were given a variety of scenarios where they could practice using these tips to respond in an assertive manner.  Intervention was effective, as evidenced by Kelsey Rojas participating in discussion on topic, reporting that she fluctuates between assertive and passive communication style depending on the person she is addressing issues with, noting that at times she doesn't always clearly express her needs or wants.  Kelsey Rojas reported that this has led to issues at times such as excessive compartmentalization to the point where she can no longer refrain from speaking up, and may appear aggressive, interrupting other parties during conversations.  Kelsey Rojas reported that one communication goal is to work on learning to compromise when necessary, and showed more effective use of assertive communication skills through engagement in roleplay activities today.     ? ?Assessment and Plan: ?Counselor recommends that Kelsey Rojas remain in IOP treatment to better manage mental health symptoms, ensure stability and pursue completion of treatment plan goals. Counselor recommends adherence to crisis/safety plan, taking medications as prescribed, and following up with medical professionals if any issues arise. ?  ?Follow Up Instructions: ?Counselor will send Webex link for next session. Kelsey Rojas was advised to call back or seek an in-person evaluation if the symptoms worsen or if the condition fails to improve as anticipated. ?  ?Collaboration of Care:   Medication Management AEB Ricky Ala, NP  ?                                         Case Manager AEB Dellia Nims, [name]  ? ? ?Patient/Guardian was advised Release  of Information must be obtained prior to any record release in order to collaborate their care with an outside provider. Patient/Guardian was advised if they have not already done so to contact the registration department to sign all necessary forms in order for [place] to release information regarding their care.  ? ?Consent: Patient/Guardian gives verbal consent for treatment and assignment of benefits for services provided during this visit. Patient/Guardian expressed understanding and agreed to proceed. ? ?I provided 180 minutes of non-face-to-face time during this encounter. ?  ?Shade Flood, LCSW, LCAS ?02/11/22  ?

## 2022-02-12 ENCOUNTER — Ambulatory Visit (HOSPITAL_COMMUNITY): Payer: Self-pay | Admitting: Psychiatry

## 2022-02-12 ENCOUNTER — Telehealth (HOSPITAL_COMMUNITY): Payer: Self-pay | Admitting: Psychiatry

## 2022-02-13 ENCOUNTER — Other Ambulatory Visit (HOSPITAL_COMMUNITY): Payer: BC Managed Care – PPO | Admitting: Licensed Clinical Social Worker

## 2022-02-13 DIAGNOSIS — F331 Major depressive disorder, recurrent, moderate: Secondary | ICD-10-CM | POA: Diagnosis not present

## 2022-02-13 DIAGNOSIS — F4310 Post-traumatic stress disorder, unspecified: Secondary | ICD-10-CM

## 2022-02-13 NOTE — Progress Notes (Signed)
Virtual Visit via Video Note ?  ?I connected with Kelsey Rojas, who prefers to go by ?Kelsey Rojas? on 02/13/22 at  9:00 AM EDT by a video enabled telemedicine application and verified that I am speaking with the correct person using two identifiers. ?  ?At orientation to the IOP program, Case Manager discussed the limitations of evaluation and management by telemedicine and the availability of in person appointments. The patient expressed understanding and agreed to proceed with virtual visits throughout the duration of the program. ?  ?Location:  ?Patient: Patient Home ?Provider: [facility] [place] [facility] ?  ?History of Present Illness: ?MDD and PTSD ?  ?Observations/Objective: ?Check In: Case Manager checked in with all participants to review discharge dates, insurance authorizations, work-related documents and needs from the treatment team regarding medications. Kelsey Rojas stated needs and engaged in discussion.  ?  ?Initial Therapeutic Activity: Counselor facilitated a check-in with Kelsey Rojas, who prefers to go by ?Kelsey Rojas? to assess for safety, sobriety and medication compliance.  Counselor also inquired about Kelsey Rojas's current emotional ratings, as well as any significant changes in thoughts, feelings or behavior since previous check in.  Kelsey Rojas presented for session on time and was alert, oriented x5, with no evidence or self-report of active SI/HI or A/V H.  Kelsey Rojas reported that she remains off any medications at present.  She denied use of alcohol or illicit substances.  Kelsey Rojas reported scores of 1/10 for depression, 4/10 for anxiety, and 0/10 for anger/irritability.  Kelsey Rojas denied any recent panic attacks.  Kelsey Rojas reported that a recent success was attending some doctor's appointments and taking a walk yesterday.  Kelsey Rojas reported that a recent struggle was experiencing an outburst yesterday when she smashed an old laptop with her brother at his urging, stating ?We did it in the front yard and I felt a lot better?Kelsey Rojas reported that her goal today is to prepare  for a trip to the beach over the weekend, which may lead her to miss group tomorrow.  She reported that she would update case manager about this potential absence tomorrow morning.    ? ?Second Therapeutic Activity: Psycho-educational portion of group was provided by Christie Beckers, Mud[employer] of community education with [employer].  Kelsey Rojas provided information on history of her local agency, mission statement, and the variety of unique services offered which group members might find beneficial to engage in, including both virtual and in-person support groups, as well as peer support program for mentoring.  Kelsey Rojas offered time to answer member's questions regarding services and encouraged them to consider utilizing these services to assist in working towards their individual wellness goals.  Intervention effectiveness could not be measured, as Kelsey Rojas did not participate in discussion on Kelsey Rojas's services today.   ? ?Third Therapeutic Activity: Counselor continued discussion upon topic of self-care today with group members.  Counselor virtually shared self-care assessment form with members via Webex to complete final sub-categories (i.e. social, spiritual, and professional).  Members were asked to rank their engagement in the activities listed for each dimension on a scale of 1-3, with 1 indicating 'Poor', 2 indicating 'Ok', and 3 indicating 'Well'.  Counselor invited members to share results of this assessment, and inquired about which areas of self-care they are doing well in, as well as areas that require attention, and how they plan to begin addressing this during treatment.  Intervention was effective, as evidenced by Kelsey Rojas successfully completing final sections of assessment and actively engaging in discussion on subject, reporting that she is excelling in  areas such as spending time with people she likes, doing enjoyable activities with other people, spending time in nature, praying, recognizing  things that give meaning to life, appreciating art that is impactful, improving professional skills, and learning things related to her profession, but would benefit from focusing more on areas such as calling or writing to friends that live far away, meeting new people, having stimulating conversations, asking others for help when needed, meditating, acting in accordance with morals and values, participating in a meaningful cause, saying ?No? to excessive responsibilities, taking on interesting projects, taking work breaks, and maintaining balance between professional and personal life.  Kelsey Rojas reported that she would work to improve self-care deficits by getting involved in new experiences that offer the chance to make new connections, adjusting boundaries with old friends that could have a positive influence on her mental health, show increased willingness to speak about feelings before a mental health crisis occurs, adding meditation to daily self-care routine, managing impulsive urges, getting involved in volunteer opportunities where she can help children, improve assertive communication skills to refuse excessive demands at work, set reminders to take breaks more often during work, and look into job opportunities that offer greater work life balance.   ? ?Assessment and Plan: ?Counselor recommends that Kelsey Rojas remain in IOP treatment to better manage mental health symptoms, ensure stability and pursue completion of treatment plan goals. Counselor recommends adherence to crisis/safety plan, taking medications as prescribed, and following up with medical professionals if any issues arise. ?  ?Follow Up Instructions: ?Counselor will send Webex link for next session. Kelsey Rojas was advised to call back or seek an in-person evaluation if the symptoms worsen or if the condition fails to improve as anticipated. ?  ?Collaboration of Care:   Medication Management AEB Ricky Ala, NP  ?                                         Case  Manager AEB Dellia Nims, [name]  ? ? ?Patient/Guardian was advised Release of Information must be obtained prior to any record release in order to collaborate their care with an outside provider. Patient/Guardian was advised if they have not already done so to contact the registration department to sign all necessary forms in order for [place] to release information regarding their care.  ? ?Consent: Patient/Guardian gives verbal consent for treatment and assignment of benefits for services provided during this visit. Patient/Guardian expressed understanding and agreed to proceed. ? ?I provided 180 minutes of non-face-to-face time during this encounter. ?  ?Shade Flood, LCSW, LCAS ?02/13/22  ?

## 2022-02-14 ENCOUNTER — Telehealth (HOSPITAL_COMMUNITY): Payer: Self-pay | Admitting: Psychiatry

## 2022-02-14 ENCOUNTER — Ambulatory Visit (HOSPITAL_COMMUNITY): Payer: Self-pay

## 2022-02-17 ENCOUNTER — Other Ambulatory Visit (HOSPITAL_COMMUNITY): Payer: BC Managed Care – PPO | Attending: Licensed Clinical Social Worker | Admitting: Psychiatry

## 2022-02-17 DIAGNOSIS — Z634 Disappearance and death of family member: Secondary | ICD-10-CM | POA: Insufficient documentation

## 2022-02-17 DIAGNOSIS — F32A Depression, unspecified: Secondary | ICD-10-CM | POA: Insufficient documentation

## 2022-02-17 DIAGNOSIS — F331 Major depressive disorder, recurrent, moderate: Secondary | ICD-10-CM

## 2022-02-17 DIAGNOSIS — F4310 Post-traumatic stress disorder, unspecified: Secondary | ICD-10-CM

## 2022-02-17 DIAGNOSIS — F419 Anxiety disorder, unspecified: Secondary | ICD-10-CM | POA: Insufficient documentation

## 2022-02-17 DIAGNOSIS — F439 Reaction to severe stress, unspecified: Secondary | ICD-10-CM | POA: Diagnosis not present

## 2022-02-17 NOTE — Patient Instructions (Signed)
D:  Patient completed MH-IOP today.  A:  Discharge today.  Follow up with Dr. Clayborne Dana @ [facility] and therapist of choice.  Return to work on 02-24-22, without any restrictions.  R:  Patient receptive. ?

## 2022-02-17 NOTE — Progress Notes (Signed)
Virtual Visit via Video Note ?  ?I connected with Kelsey Rojas, who prefers to go by ?Kelsey Rojas? on 02/17/22 at  9:00 AM EDT by a video enabled telemedicine application and verified that I am speaking with the correct person using two identifiers. ?  ?At orientation to the IOP program, Case Manager discussed the limitations of evaluation and management by telemedicine and the availability of in person appointments. The patient expressed understanding and agreed to proceed with virtual visits throughout the duration of the program. ?  ?Location:  ?Patient: Patient Home ?Provider: [facility] ?  ?History of Present Illness: ?MDD and PTSD ?  ?Observations/Objective: ?Check In: Case Manager checked in with all participants to review discharge dates, insurance authorizations, work-related documents and needs from the treatment team regarding medications. Kelsey Rojas stated needs and engaged in discussion.  ?  ?Initial Therapeutic Activity: Counselor facilitated a check-in with Kelsey Rojas, who prefers to go by ?Kelsey Rojas? to assess for safety, sobriety and medication compliance.  Counselor also inquired about Kelsey Rojas's current emotional ratings, as well as any significant changes in thoughts, feelings or behavior since previous check in.  Kelsey Rojas presented for session on time and was alert, oriented x5, with no evidence or self-report of active SI/HI or A/V H.  Kelsey Rojas denied being on any medication, but reported that she will be meeting with a psychiatrist tomorrow and discuss ongoing sleep issues.  She denied use of alcohol or illicit substances.  Kelsey Rojas reported scores of 4/10 for depression, 8/10 for anxiety, and 3/10 for anger/irritability.  Kelsey Rojas denied any recent outbursts or panic attacks.  Kelsey Rojas reported that a recent success was visiting the beach for vacation over the weekend with friends, stating ?It was a really good time?Kelsey Rojas reported that a recent struggle was having to clean up after her dog, who acted out by tearing up some things around the  home while she was away.  Kelsey Rojas reported that her goal today is to clean up around the home and unpack from her trip.      ? ?Second Therapeutic Activity: Counselor introduced topic of anger management today.  Counselor virtually shared a handout with members on this subject featuring a variety of coping skills, and facilitated discussion on these approaches.  Examples included raising awareness of anger triggers, practicing deep breathing, keeping an anger log to better understand episodes, using diversion activities to distract oneself for 30 minutes, taking a time out when necessary, and being mindful of warning signs tied to thoughts or behavior.  Counselor inquired about which techniques group members have used before, what has proved to be helpful, what their unique warning signs might be, as well as what they will try out in the future to assist with de-escalation.  Intervention was effective, as evidenced by Kelsey Rojas successfully participating in discussion on the subject and reporting that she tends to compartmentalize difficult feelings like anger until they culminate in outbursts towards those causing her problems.  Kelsey Rojas reported that her triggers can include unfair treatment, feeling criticized, lied to, feeling that her time is wasted, or rudeness.  Kelsey Rojas reported that warning signs of an impending crisis or outburst include finding it difficult to focus, experiencing rising body temperature, or racing heartbeat.  She reported that several coping skills offered from material today were appealing to her, including practicing positive affirmations to change perspective, meditating, or engaging in a cardio exercise for an outlet such as boxing.    ? ?Assessment and Plan: Kelsey Rojas reported that she is ready  for discharged from program today.  Counselor recommends adherence to crisis/safety plan, taking medications as prescribed, and following up with medical professionals if any issues arise. ?  ?Follow Up  Instructions: ?Kelsey Rojas was advised to call back or seek an in-person evaluation if the symptoms worsen or if the condition fails to improve as anticipated. ?  ?Collaboration of Care:   Medication Management AEB Hillery Jacks, NP  ?                                         Case Manager AEB Jeri Modena, [name]  ? ? ?Patient/Guardian was advised Release of Information must be obtained prior to any record release in order to collaborate their care with an outside provider. Patient/Guardian was advised if they have not already done so to contact the registration department to sign all necessary forms in order for [place] to release information regarding their care.  ? ?Consent: Patient/Guardian gives verbal consent for treatment and assignment of benefits for services provided during this visit. Patient/Guardian expressed understanding and agreed to proceed. ? ?I provided 180 minutes of non-face-to-face time during this encounter. ?  ?Noralee Stain, LCSW, LCAS ?02/17/22  ?
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
## 2022-02-18 ENCOUNTER — Ambulatory Visit (HOSPITAL_COMMUNITY): Payer: Self-pay

## 2022-02-18 ENCOUNTER — Encounter (HOSPITAL_COMMUNITY): Payer: Self-pay | Admitting: Psychiatry

## 2022-02-18 NOTE — Progress Notes (Signed)
Virtual Visit via Telephone Note ? ?I connected with Kelsey Rojas on 02/18/22 at  9:00 AM EDT by telephone and verified that I am speaking with the correct person using two identifiers. ? ?Location: ?Patient: home ?Provider: office  ?I discussed the limitations, risks, security and privacy concerns of performing an evaluation and management service by telephone and the availability of in person appointments. I also discussed with the patient that there may be a patient responsible charge related to this service. The patient expressed understanding and agreed to proceed. ? ?  ?I discussed the assessment and treatment plan with the patient. The patient was provided an opportunity to ask questions and all were answered. The patient agreed with the plan and demonstrated an understanding of the instructions. ?  ?The patient was advised to call back or seek an in-person evaluation if the symptoms worsen or if the condition fails to improve as anticipated. ? ?I provided 15 minutes of non-face-to-face time during this encounter. ? ? ?Kelsey Rack, NP ? ? ?[name] ?Intensive Outpatient Program ?Discharge Summary ? ?Kelsey Rojas ?[id number] ? ?Admission date: 01/29/2022 ?Discharge date: 02/17/2022 ? ?Reason for admission: Per admission assessment note: Kelsey Rojas is a 26 year old African-American female who presents with worsening depression and anxiety.  Reports a history of posttraumatic stress disorder denies that she has been followed by therapy or psychiatry recently. Kelsey Rojas  is denying previous inpatient admissions.  Denying suicidal or homicidal ideations.  Denies auditory visual hallucinations.  Denied that she is currently prescribed any psychotropic medications.  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  Kelsey Rojas reports the passing of program grandmother with a anniversary  approaching.  ? ?Progress in Program Toward Treatment Goals: Progressing patient attended and participated within the group session when active and engaged participation.  Denying suicidal or homicidal ideations.  Denies auditory visual hallucinations.  Declined any medication refills at discharge.  States she has a follow-up appointment scheduled with her primary psychiatrist.  Support,encouragement and  reassurance was provided. ? ?Progress (rationale): Keep follow-up with  all outpatient providers ? ?Collaboration of Care: Referral or follow-up with counselor/therapist AEB see case management discharge notes.  ? ?Patient/Guardian was advised Release of Information must be obtained prior to any record release in order to collaborate their care with an outside provider. Patient/Guardian was advised if they have not already done so to contact the registration department to sign all necessary forms in order for [place] to release information regarding their care.  ? ?Consent: Patient/Guardian gives verbal consent for treatment and assignment of benefits for services provided during this visit. Patient/Guardian expressed understanding and agreed to proceed.  ? ?Kelsey Rojas ?02/18/2022 ?

## 2022-02-19 ENCOUNTER — Ambulatory Visit (HOSPITAL_COMMUNITY): Payer: Self-pay

## 2022-02-20 ENCOUNTER — Ambulatory Visit (HOSPITAL_COMMUNITY): Payer: Self-pay

## 2022-02-21 ENCOUNTER — Ambulatory Visit (HOSPITAL_COMMUNITY): Payer: Self-pay

## 2022-04-10 HISTORY — PX: CHOLECYSTECTOMY, LAPAROSCOPIC: SHX56

## 2022-07-20 IMAGING — DX DG CHEST 1V PORT
1 series · 1 of 1 positions shown · non-contrast
Comparison: Chest x-ray 02/02/2020

CLINICAL DATA: Chest pain

EXAM:
PORTABLE CHEST 1 VIEW

[chest ap]
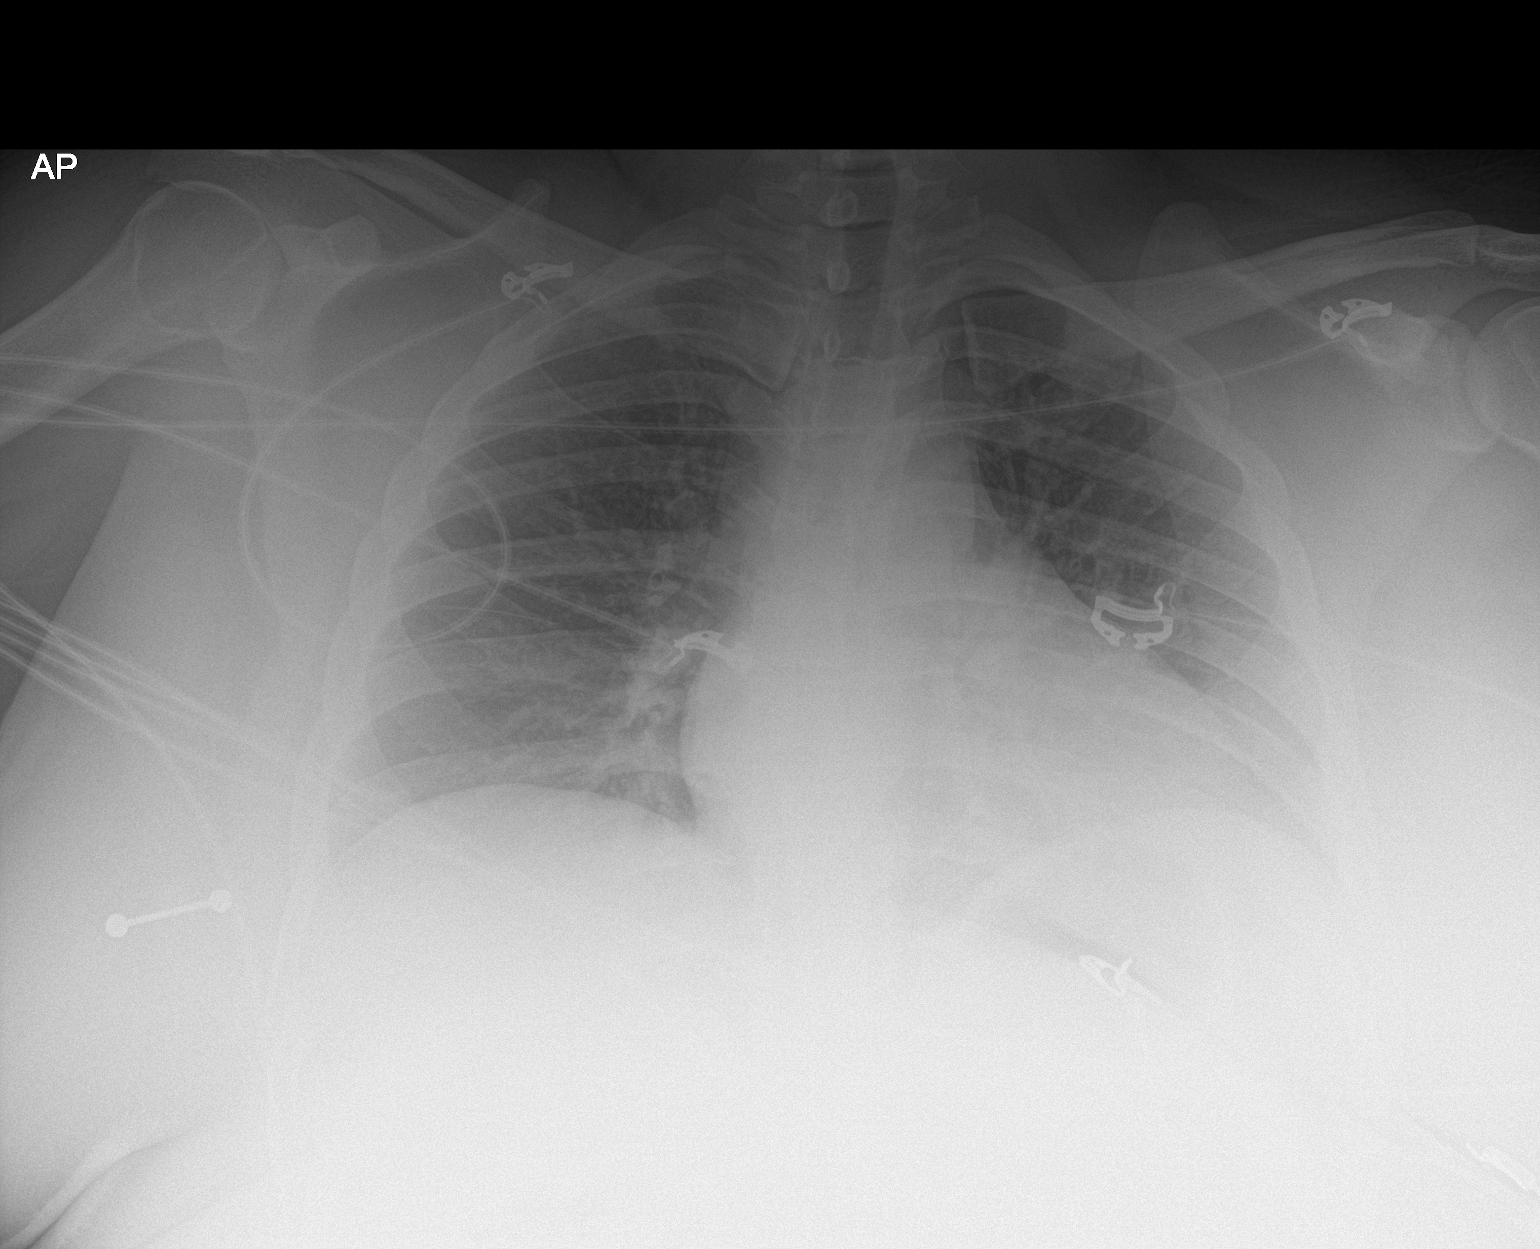

[1 of 1 positions shown; findings below may reference images not displayed]

FINDINGS: Somewhat limited due to body habitus and portable technique.

Heart size and mediastinal contours are within normal limits. No
suspicious pulmonary opacities identified.

No pleural effusion or pneumothorax visualized.

No acute osseous abnormality appreciated.
IMPRESSION: No acute intrathoracic process identified.

## 2023-03-31 ENCOUNTER — Ambulatory Visit (INDEPENDENT_AMBULATORY_CARE_PROVIDER_SITE_OTHER): Payer: BC Managed Care – PPO | Admitting: Diagnostic Neuroimaging

## 2023-03-31 ENCOUNTER — Encounter: Payer: Self-pay | Admitting: Diagnostic Neuroimaging

## 2023-03-31 VITALS — BP 152/99 | HR 72 | Ht 62.0 in | Wt 280.0 lb

## 2023-03-31 DIAGNOSIS — G43109 Migraine with aura, not intractable, without status migrainosus: Secondary | ICD-10-CM | POA: Diagnosis not present

## 2023-03-31 MED ORDER — RIZATRIPTAN BENZOATE 10 MG PO TBDP: 10.0000 mg | ORAL_TABLET | ORAL | 11 refills | Status: DC | PRN

## 2023-03-31 NOTE — Progress Notes (Signed)
[facility]  PATIENT: Kelsey Rojas DOB: 01-Nov-1996  REFERRING CLINICIAN: Caffie Damme, MD HISTORY FROM: patient  REASON FOR VISIT: new consult   HISTORICAL  CHIEF COMPLAINT:  Chief Complaint  Patient presents with   Migraine    RM 6 alone Pt is well, report she has been having 3 migraines a week since 2008, sometimes daily. Associated dizziness, nausea, vomiting, sensitivity to light/sound/motion/smell and aura.  Triggers related to stress.     HISTORY OF PRESENT ILLNESS:   27 year old female here for evaluation of headaches.  Onset of headaches around 2008 with left-sided throbbing sensation, pain in the eyes, nausea vomiting, sensitive to light and sound, sometimes associated with visual aura and flashes.  Headaches have gradually increased over time.  Now having 3-4 headaches per week.  Strong family history of migraine on mother side.  Marica Otter but this caused hives after 2 weeks.  Tried ibuprofen with mild relief.  Planning to start topiramate soon for weight loss.   REVIEW OF SYSTEMS: Full 14 system review of systems performed and negative with exception of: as per HPI.  ALLERGIES: Allergies  Allergen Reactions   Penicillins Rash and Hives    Has patient had a PCN reaction causing immediate rash, facial/tongue/throat swelling, SOB or lightheadedness with hypotension: No Has patient had a PCN reaction causing severe rash involving mucus membranes or skin necrosis: No Has patient had a PCN reaction that required hospitalization: No Has patient had a PCN reaction occurring within the last 10 years: #  #  #  YES  #  #  #  If all of the above answers are "NO", then may proceed with Cephalosporin use.  Has patient had a PCN reaction causing immediate rash, facial/tongue/throat swelling, SOB or lightheadedness with hypotension: No Has patient had a PCN reaction causing severe rash involving mucus membranes or skin necrosis: No Has patient had a PCN  reaction that required hospitalization: No Has patient had a PCN reaction occurring within the last 10 years: #  #  #  YES  #  #  #  If all of the above answers are "NO", then may proceed with Cephalosporin use.   Ciprofloxacin Hives    Family history of a reaction UNSPECIFIED FAMILY REACTION Family history of a reaction UNSPECIFIED FAMILY REACTION   Amoxicillin Rash   Qulipta [Atogepant] Hives and Rash    HOME MEDICATIONS: Outpatient Medications Prior to Visit  Medication Sig Dispense Refill   ibuprofen (ADVIL) 800 MG tablet Take 1 tablet (800 mg total) by mouth every 8 (eight) hours as needed. 21 tablet 0   omeprazole (PRILOSEC) 20 MG capsule Take 20 mg by mouth daily.     ondansetron (ZOFRAN ODT) 8 MG disintegrating tablet Take 1 tablet (8 mg total) by mouth every 8 (eight) hours as needed for nausea or vomiting. 10 tablet 0   phentermine 30 MG capsule Take 30 mg by mouth every morning.     topiramate (TOPAMAX) 25 MG tablet Take 25 mg by mouth 2 (two) times daily.     azithromycin (ZITHROMAX) 250 MG tablet Take 1 tablet (250 mg total) by mouth daily. Take first 2 tablets together, then 1 every day until finished. (Patient not taking: Reported on 03/31/2023) 6 tablet 0   diclofenac Sodium (VOLTAREN) 1 % GEL Apply 2 g topically 4 (four) times daily as needed. (Patient not taking: Reported on 01/28/2022) 100 g 0   No facility-administered medications prior to visit.    PAST MEDICAL  HISTORY: Past Medical History:  Diagnosis Date   GERD (gastroesophageal reflux disease)    Macromastia 09/2018   Migraines    Morbid obesity (HCC)     PAST SURGICAL HISTORY: Past Surgical History:  Procedure Laterality Date   BREAST REDUCTION SURGERY Bilateral 10/12/2018   Procedure: BILATERAL MAMMARY REDUCTION  (BREAST);  Surgeon: Contogiannis, Chales Abrahams, MD;  Location: [facility] OR;  Service: Plastics;  Laterality: Bilateral;  BILATERAL MAMMARY REDUCTION  (BREAST)   CHOLECYSTECTOMY, LAPAROSCOPIC  04/10/2022    UPPER GASTROINTESTINAL ENDOSCOPY      FAMILY HISTORY: Family History  Problem Relation Age of Onset   Depression Mother    Anxiety disorder Mother    Hypertension Mother    Cancer Father    Hypertension Father    Alcohol abuse Paternal Grandfather     SOCIAL HISTORY: Social History   Socioeconomic History   Marital status: Single    Spouse name: Not on file   Number of children: 0   Years of education: Not on file   Highest education level: Bachelor's degree (e.g., BA, AB, BS)  Occupational History   Not on file  Tobacco Use   Smoking status: Never   Smokeless tobacco: Never  Vaping Use   Vaping Use: Never used  Substance and Sexual Activity   Alcohol use: Yes    Comment: occasionally   Drug use: No   Sexual activity: Yes    Birth control/protection: None  Other Topics Concern   Not on file  Social History Narrative   Not on file   Social Determinants of Health   Financial Resource Strain: Not on file  Food Insecurity: Not on file  Transportation Needs: Not on file  Physical Activity: Not on file  Stress: Not on file  Social Connections: Not on file  Intimate Partner Violence: Not on file     PHYSICAL EXAM  GENERAL EXAM/CONSTITUTIONAL: Vitals:  Vitals:   03/31/23 1139 03/31/23 1143  BP: (!) 157/98 (!) 152/99  Pulse: 77 72  Weight: 280 lb (127 kg)   Height: 5\' 2"  (1.575 m)    Body mass index is 51.21 kg/m. Wt Readings from Last 3 Encounters:  03/31/23 280 lb (127 kg)  11/21/21 274 lb 14.6 oz (124.7 kg)  11/05/21 275 lb (124.7 kg)   Patient is in no distress; well developed, nourished and groomed; neck is supple  CARDIOVASCULAR: Examination of carotid arteries is normal; no carotid bruits Regular rate and rhythm, no murmurs Examination of peripheral vascular system by observation and palpation is normal  EYES: Ophthalmoscopic exam of optic discs and posterior segments is normal; no papilledema or hemorrhages No results  found.  MUSCULOSKELETAL: Gait, strength, tone, movements noted in Neurologic exam below  NEUROLOGIC: MENTAL STATUS:      No data to display         awake, alert, oriented to person, place and time recent and remote memory intact normal attention and concentration language fluent, comprehension intact, naming intact fund of knowledge appropriate  CRANIAL NERVE:  2nd - no papilledema on fundoscopic exam 2nd, 3rd, 4th, 6th - pupils equal and reactive to light, visual fields full to confrontation, extraocular muscles intact, no nystagmus 5th - facial sensation symmetric 7th - facial strength symmetric 8th - hearing intact 9th - palate elevates symmetrically, uvula midline 11th - shoulder shrug symmetric 12th - tongue protrusion midline  MOTOR:  normal bulk and tone, full strength in the BUE, BLE  SENSORY:  normal and symmetric to light touch, temperature,  vibration  COORDINATION:  finger-nose-finger, fine finger movements normal  REFLEXES:  deep tendon reflexes TRACE and symmetric  GAIT/STATION:  narrow based gait     DIAGNOSTIC DATA (LABS, IMAGING, TESTING) - I reviewed patient records, labs, notes, testing and imaging myself where available.  Lab Results  Component Value Date   WBC 8.8 11/21/2021   HGB 13.0 11/21/2021   HCT 41.1 11/21/2021   MCV 87.6 11/21/2021   PLT 264 11/21/2021      Component Value Date/Time   NA 137 11/21/2021 2119   K 3.2 (L) 11/21/2021 2119   CL 105 11/21/2021 2119   CO2 22 11/21/2021 2119   GLUCOSE 81 11/21/2021 2119   BUN 9 11/21/2021 2119   CREATININE 0.72 11/21/2021 2119   CALCIUM 8.6 (L) 11/21/2021 2119   PROT 7.9 11/21/2021 2119   ALBUMIN 4.3 11/21/2021 2119   AST 19 11/21/2021 2119   ALT 24 11/21/2021 2119   ALKPHOS 70 11/21/2021 2119   BILITOT 0.7 11/21/2021 2119   GFRNONAA >60 11/21/2021 2119   GFRAA >60 01/27/2020 0948   No results found for: "CHOL", "HDL", "LDLCALC", "LDLDIRECT", "TRIG", "CHOLHDL" No  results found for: "HGBA1C" No results found for: "VITAMINB12" No results found for: "TSH"     ASSESSMENT AND PLAN  27 y.o. year old female here with:  Meds tried: qulipta (hives), tizanidine, ibuprofen   Dx:  1. Migraine with aura and without status migrainosus, not intractable      PLAN:   MIGRAINE WITH AURA  MIGRAINE TREATMENT PLAN:  MIGRAINE PREVENTION  LIFESTYLE CHANGES -Stop or avoid smoking -Decrease or avoid caffeine / alcohol -Eat and sleep on a regular schedule -Exercise several times per week - start topiramate 25mg  daily; then 25mg  twice a day (started per PCP); then up to 50mg  twice a day   Consider 2nd line - rimegepant (Nurtec) 75mg  every other day - erenumab (Aimovig) 70mg  monthly (may increase to 140mg  monthly) - fremanezumab (Ajovy) 225mg  monthly (or 675mg  every 3 months) - galazanezumab (Emgality) 240mg  loading dose; then 120mg  monthly   MIGRAINE RESCUE  - ibuprofen as needed - start rizatriptan (Maxalt) 10mg  as needed for breakthrough headache; may repeat x 1 after 2 hours; max 2 tabs per day or 8 per month - consider rimegepant (Nurtec) 75mg  as needed for breakthrough headache; max 8 per month (caution with qulipta reaction)  Meds ordered this encounter  Medications   rizatriptan (MAXALT-MLT) 10 MG disintegrating tablet    Sig: Take 1 tablet (10 mg total) by mouth as needed for migraine. May repeat in 2 hours if needed    Dispense:  9 tablet    Refill:  11   Return in about 6 months (around 10/01/2023) for with NP.    Suanne Marker, MD 03/31/2023, 12:21 PM Certified in Neurology, Neurophysiology and Neuroimaging  [facility] [address] [place], [place] [address] [phone]

## 2023-03-31 NOTE — Patient Instructions (Addendum)
MIGRAINE PREVENTION  LIFESTYLE CHANGES -Stop or avoid smoking -Decrease or avoid caffeine / alcohol -Eat and sleep on a regular schedule -Exercise several times per week - start topiramate 25mg  daily; then 25mg  twice a day (started per PCP); then up to 50mg  twice a day    MIGRAINE RESCUE  - ibuprofen as needed - start rizatriptan (Maxalt) 10mg  as needed for breakthrough headache; may repeat x 1 after 2 hours; max 2 tabs per day or 8 per month

## 2023-04-08 ENCOUNTER — Telehealth (HOSPITAL_COMMUNITY): Payer: Self-pay | Admitting: Psychiatry

## 2023-04-09 ENCOUNTER — Telehealth (HOSPITAL_COMMUNITY): Payer: Self-pay | Admitting: Psychiatry

## 2023-04-15 ENCOUNTER — Ambulatory Visit: Payer: BC Managed Care – PPO | Admitting: Neurology

## 2023-04-21 ENCOUNTER — Other Ambulatory Visit (HOSPITAL_COMMUNITY): Payer: BC Managed Care – PPO | Admitting: Psychiatry

## 2023-04-22 ENCOUNTER — Ambulatory Visit (HOSPITAL_COMMUNITY): Payer: BC Managed Care – PPO

## 2023-04-22 ENCOUNTER — Ambulatory Visit: Payer: BC Managed Care – PPO | Admitting: Neurology

## 2023-04-23 ENCOUNTER — Ambulatory Visit (HOSPITAL_COMMUNITY): Payer: BC Managed Care – PPO

## 2023-04-24 ENCOUNTER — Ambulatory Visit (HOSPITAL_COMMUNITY): Payer: BC Managed Care – PPO

## 2023-04-27 ENCOUNTER — Ambulatory Visit (HOSPITAL_COMMUNITY): Payer: BC Managed Care – PPO

## 2023-04-28 ENCOUNTER — Ambulatory Visit (HOSPITAL_COMMUNITY): Payer: BC Managed Care – PPO

## 2023-04-29 ENCOUNTER — Ambulatory Visit (HOSPITAL_COMMUNITY): Payer: BC Managed Care – PPO

## 2023-04-30 ENCOUNTER — Ambulatory Visit (HOSPITAL_COMMUNITY): Payer: BC Managed Care – PPO

## 2023-05-01 ENCOUNTER — Ambulatory Visit (HOSPITAL_COMMUNITY): Payer: BC Managed Care – PPO

## 2023-05-04 ENCOUNTER — Ambulatory Visit (HOSPITAL_COMMUNITY): Payer: BC Managed Care – PPO

## 2023-05-05 ENCOUNTER — Ambulatory Visit (HOSPITAL_COMMUNITY): Payer: BC Managed Care – PPO

## 2023-05-06 ENCOUNTER — Ambulatory Visit (HOSPITAL_COMMUNITY): Payer: BC Managed Care – PPO

## 2023-05-07 ENCOUNTER — Ambulatory Visit (HOSPITAL_COMMUNITY): Payer: BC Managed Care – PPO

## 2023-05-08 ENCOUNTER — Ambulatory Visit (HOSPITAL_COMMUNITY): Payer: BC Managed Care – PPO

## 2023-05-11 ENCOUNTER — Ambulatory Visit (HOSPITAL_COMMUNITY): Payer: BC Managed Care – PPO

## 2023-05-12 ENCOUNTER — Ambulatory Visit (HOSPITAL_COMMUNITY): Payer: BC Managed Care – PPO

## 2023-05-13 ENCOUNTER — Ambulatory Visit (HOSPITAL_COMMUNITY): Payer: BC Managed Care – PPO

## 2023-05-14 ENCOUNTER — Ambulatory Visit (HOSPITAL_COMMUNITY): Payer: BC Managed Care – PPO

## 2023-05-15 ENCOUNTER — Ambulatory Visit (HOSPITAL_COMMUNITY): Payer: BC Managed Care – PPO

## 2023-06-19 ENCOUNTER — Emergency Department (HOSPITAL_BASED_OUTPATIENT_CLINIC_OR_DEPARTMENT_OTHER)
Admission: EM | Admit: 2023-06-19 | Discharge: 2023-06-19 | Disposition: A | Discharge status: Home / Self Care | Payer: BC Managed Care – PPO | Attending: Emergency Medicine | Admitting: Emergency Medicine

## 2023-06-19 ENCOUNTER — Encounter (HOSPITAL_BASED_OUTPATIENT_CLINIC_OR_DEPARTMENT_OTHER): Payer: Self-pay

## 2023-06-19 ENCOUNTER — Other Ambulatory Visit: Payer: Self-pay

## 2023-06-19 DIAGNOSIS — Z1152 Encounter for screening for COVID-19: Secondary | ICD-10-CM | POA: Diagnosis not present

## 2023-06-19 DIAGNOSIS — R059 Cough, unspecified: Secondary | ICD-10-CM | POA: Diagnosis present

## 2023-06-19 DIAGNOSIS — J069 Acute upper respiratory infection, unspecified: Secondary | ICD-10-CM

## 2023-06-19 LAB — RESP PANEL BY RT-PCR (RSV, FLU A&B, COVID)  RVPGX2
Influenza A by PCR: NEGATIVE
Influenza B by PCR: NEGATIVE
Resp Syncytial Virus by PCR: NEGATIVE
SARS Coronavirus 2 by RT PCR: NEGATIVE

## 2023-06-19 MED ORDER — PROMETHAZINE-DM 6.25-15 MG/5ML PO SYRP: 1.2500 mL | ORAL_SOLUTION | Freq: Four times a day (QID) | ORAL | 0 refills | Status: AC | PRN

## 2023-06-19 NOTE — Discharge Instructions (Addendum)
You were given a prescription for cough syrup, please take 1.3 mL by mouth approximately 4 times a day.  Please make sure you are eating while taking this medication.  If you do not experience any improvement in your symptoms after 1 week, you may return to the emergency department.

## 2023-06-19 NOTE — ED Triage Notes (Signed)
Patient has had chills, cough and body aches this week.

## 2023-06-19 NOTE — ED Provider Notes (Signed)
[place] [facility] Provider Note   CSN: [id number] Arrival date & time: 06/19/23  1939     History  Chief Complaint  Patient presents with   Cough    Kelsey Rojas is a 27 y.o. female.  27 y.o female with no PMH presents to the ED with a chief complaint of cough, body aches, chills which have been ongoing for the past week.  Patient endorses continuous dry cough, has tried over-the-counter medications such as Robitussin, Tylenol, DayQuil, NyQuil without any improvement in symptoms.  Cough is exacerbated with any type of movement and activity.  She also endorses some chills, however has been taking Tylenol reports the subsided on Wednesday.  She does not have any prior history of asthma, no prior history of tobacco use.  No reported fever, no chest pain or shortness of breath.  The history is provided by the patient.  Cough Associated symptoms: chills   Associated symptoms: no chest pain and no fever        Home Medications Prior to Admission medications   Medication Sig Start Date End Date Taking? Authorizing Provider  promethazine-dextromethorphan (PROMETHAZINE-DM) 6.25-15 MG/5ML syrup Take 1.3 mLs by mouth 4 (four) times daily as needed for up to 7 days for cough. 06/19/23 06/26/23 Yes Jasmaine Rochel, Leonie Douglas, PA-C  ibuprofen (ADVIL) 800 MG tablet Take 1 tablet (800 mg total) by mouth every 8 (eight) hours as needed. 11/05/21   Long, Arlyss Repress, MD  omeprazole (PRILOSEC) 20 MG capsule Take 20 mg by mouth daily. 11/29/19   [provider]  ondansetron (ZOFRAN ODT) 8 MG disintegrating tablet Take 1 tablet (8 mg total) by mouth every 8 (eight) hours as needed for nausea or vomiting. 02/03/20   Molpus, John, MD  phentermine 30 MG capsule Take 30 mg by mouth every morning.    [provider]  rizatriptan (MAXALT-MLT) 10 MG disintegrating tablet Take 1 tablet (10 mg total) by mouth as needed for migraine. May repeat in 2 hours if needed 03/31/23    Penumalli, Glenford Bayley, MD  topiramate (TOPAMAX) 25 MG tablet Take 25 mg by mouth 2 (two) times daily.    [provider]      Allergies    Penicillins, Ciprofloxacin, Amoxicillin, and Qulipta [atogepant]    Review of Systems   Review of Systems  Constitutional:  Positive for chills. Negative for fever.  Respiratory:  Positive for cough.   Cardiovascular:  Negative for chest pain.  Gastrointestinal:  Negative for abdominal pain.    Physical Exam Updated Vital Signs BP (!) 170/119 (BP Location: Left Arm)   Pulse 86   Temp 98 F (36.7 C) (Oral)   Resp 18   Ht 5\' 2"  (1.575 m)   Wt 123.4 kg   LMP 03/03/2023   SpO2 100%   BMI 49.75 kg/m  Physical Exam Vitals and nursing note reviewed.  Constitutional:      Appearance: Normal appearance.  HENT:     Head: Normocephalic and atraumatic.     Nose:     Right Sinus: Maxillary sinus tenderness and frontal sinus tenderness present.     Left Sinus: Maxillary sinus tenderness and frontal sinus tenderness present.     Mouth/Throat:     Mouth: Mucous membranes are dry.     Pharynx: Posterior oropharyngeal erythema present.     Comments: Oropharynx is clear but with some mild erythema, no tonsillar exudate or PTA noted. Cardiovascular:     Rate and Rhythm: Normal  rate.  Pulmonary:     Effort: Pulmonary effort is normal.     Breath sounds: No wheezing or rales.     Comments: No wheezing, no rhonchi or rales. Abdominal:     General: Abdomen is flat.     Tenderness: There is no abdominal tenderness.  Musculoskeletal:     Cervical back: Normal range of motion and neck supple.     Right lower leg: No edema.     Left lower leg: No edema.  Skin:    General: Skin is warm and dry.  Neurological:     Mental Status: She is alert and oriented to person, place, and time.     ED Results / Procedures / Treatments   Labs (all labs ordered are listed, but only abnormal results are displayed) Labs Reviewed  RESP PANEL BY RT-PCR  (RSV, FLU A&B, COVID)  [id number]    EKG None  Radiology No results found.  Procedures Procedures    Medications Ordered in ED Medications - No data to display  ED Course/ Medical Decision Making/ A&P                                 Medical Decision Making  Patient presents to the ED with a chief complaint of cough which has been ongoing for the past week.  Dry cough, no improvement despite multiple over-the-counter medications.  On exam she is overall nontoxic-appearing, lungs are clear to auscultation, oropharynx is clear with some erythema but no PTA or tonsillar exudate noted.  Respiratory panel is negative for COVID-19, influenza, I discussed with patient symptomatic treatment.  I we will be prescribing her a short course of cough suppressant to help with her symptoms.  We discussed if symptoms not improved in 1 week she can return to the emergency department for further evaluation.  Patient is hemodynamically stable for discharge.   Portions of this note were generated with Scientist, clinical (histocompatibility and immunogenetics). Dictation errors may occur despite best attempts at proofreading.   Final Clinical Impression(s) / ED Diagnoses Final diagnoses:  Viral URI with cough    Rx / DC Orders ED Discharge Orders          Ordered    promethazine-dextromethorphan (PROMETHAZINE-DM) 6.25-15 MG/5ML syrup  4 times daily PRN        06/19/23 2054              Claude Manges, PA-C 06/19/23 2059    Glyn Ade, MD 06/19/23 2257

## 2023-09-14 ENCOUNTER — Telehealth: Payer: Self-pay | Admitting: Diagnostic Neuroimaging

## 2023-09-14 NOTE — Telephone Encounter (Addendum)
Pt called and LVM to update address in chart. I called pt back and there was no answer. She stated in message that she was needing to update address in chart and would like to know what mail has been returned as well. When pt calls back please update chart.

## 2023-10-12 ENCOUNTER — Ambulatory Visit: Payer: BC Managed Care – PPO | Admitting: Adult Health

## 2023-11-04 NOTE — Progress Notes (Unsigned)
[facility]  PATIENT: Kelsey Rojas DOB: 08/30/1996  REFERRING CLINICIAN: Caffie Damme, MD HISTORY FROM: patient  REASON FOR VISIT: Migraine headaches   HISTORICAL  CHIEF COMPLAINT:  No chief complaint on file.   HISTORY OF PRESENT ILLNESS:   Update 11/04/2023 JM: Patient returns for migraine follow-up visit.  At prior visit, she was started on topiramate for prevention and rizatriptan for rescue.        Consult visit 03/31/2023 Dr. Marjory Lies: 27 year old female here for evaluation of headaches.  Onset of headaches around 2008 with left-sided throbbing sensation, pain in the eyes, nausea vomiting, sensitive to light and sound, sometimes associated with visual aura and flashes.  Headaches have gradually increased over time.  Now having 3-4 headaches per week.  Strong family history of migraine on mother side.  Marica Otter but this caused hives after 2 weeks.  Tried ibuprofen with mild relief.  Planning to start topiramate soon for weight loss.   REVIEW OF SYSTEMS: Full 14 system review of systems performed and negative with exception of: as per HPI.  ALLERGIES: Allergies  Allergen Reactions   Penicillins Rash and Hives    Has patient had a PCN reaction causing immediate rash, facial/tongue/throat swelling, SOB or lightheadedness with hypotension: No Has patient had a PCN reaction causing severe rash involving mucus membranes or skin necrosis: No Has patient had a PCN reaction that required hospitalization: No Has patient had a PCN reaction occurring within the last 10 years: #  #  #  YES  #  #  #  If all of the above answers are "NO", then may proceed with Cephalosporin use.  Has patient had a PCN reaction causing immediate rash, facial/tongue/throat swelling, SOB or lightheadedness with hypotension: No Has patient had a PCN reaction causing severe rash involving mucus membranes or skin necrosis: No Has patient had a PCN reaction that required  hospitalization: No Has patient had a PCN reaction occurring within the last 10 years: #  #  #  YES  #  #  #  If all of the above answers are "NO", then may proceed with Cephalosporin use.   Ciprofloxacin Hives    Family history of a reaction UNSPECIFIED FAMILY REACTION Family history of a reaction UNSPECIFIED FAMILY REACTION   Amoxicillin Rash   Qulipta [Atogepant] Hives and Rash    HOME MEDICATIONS: Outpatient Medications Prior to Visit  Medication Sig Dispense Refill   ibuprofen (ADVIL) 800 MG tablet Take 1 tablet (800 mg total) by mouth every 8 (eight) hours as needed. 21 tablet 0   omeprazole (PRILOSEC) 20 MG capsule Take 20 mg by mouth daily.     ondansetron (ZOFRAN ODT) 8 MG disintegrating tablet Take 1 tablet (8 mg total) by mouth every 8 (eight) hours as needed for nausea or vomiting. 10 tablet 0   phentermine 30 MG capsule Take 30 mg by mouth every morning.     rizatriptan (MAXALT-MLT) 10 MG disintegrating tablet Take 1 tablet (10 mg total) by mouth as needed for migraine. May repeat in 2 hours if needed 9 tablet 11   topiramate (TOPAMAX) 25 MG tablet Take 25 mg by mouth 2 (two) times daily.     No facility-administered medications prior to visit.    PAST MEDICAL HISTORY: Past Medical History:  Diagnosis Date   GERD (gastroesophageal reflux disease)    Macromastia 09/2018   Migraines    Morbid obesity (HCC)     PAST SURGICAL HISTORY: Past Surgical History:  Procedure  Laterality Date   BREAST REDUCTION SURGERY Bilateral 10/12/2018   Procedure: BILATERAL MAMMARY REDUCTION  (BREAST);  Surgeon: Contogiannis, Chales Abrahams, MD;  Location: [facility] OR;  Service: Plastics;  Laterality: Bilateral;  BILATERAL MAMMARY REDUCTION  (BREAST)   CHOLECYSTECTOMY, LAPAROSCOPIC  04/10/2022   UPPER GASTROINTESTINAL ENDOSCOPY      FAMILY HISTORY: Family History  Problem Relation Age of Onset   Depression Mother    Anxiety disorder Mother    Hypertension Mother    Cancer Father     Hypertension Father    Alcohol abuse Paternal Grandfather     SOCIAL HISTORY: Social History   Socioeconomic History   Marital status: Single    Spouse name: Not on file   Number of children: 0   Years of education: Not on file   Highest education level: Bachelor's degree (e.g., BA, AB, BS)  Occupational History   Not on file  Tobacco Use   Smoking status: Never   Smokeless tobacco: Never  Vaping Use   Vaping status: Never Used  Substance and Sexual Activity   Alcohol use: Yes    Comment: occasionally   Drug use: No   Sexual activity: Yes    Birth control/protection: None  Other Topics Concern   Not on file  Social History Narrative   Not on file   Social Drivers of Health   Financial Resource Strain: Low Risk  (03/26/2023)   Received from [facility], [facility]   Overall Financial Resource Strain (CARDIA)    Difficulty of Paying Living Expenses: Not very hard  Food Insecurity: No Food Insecurity (03/26/2023)   Received from [facility], [facility]   Hunger Vital Sign    Worried About Running Out of Food in the Last Year: Never true    Ran Out of Food in the Last Year: Never true  Transportation Needs: No Transportation Needs (03/26/2023)   Received from [employer], [facility]   PRAPARE - Transportation    Lack of Transportation (Medical): No    Lack of Transportation (Non-Medical): No  Physical Activity: Insufficiently Active (03/26/2023)   Received from [facility], [facility]   Exercise Vital Sign    Days of Exercise per Week: 3 days    Minutes of Exercise per Session: 30 min  Stress: Stress Concern Present (03/26/2023)   Received from [facility], [facility] - Occupational Stress Questionnaire    Feeling of Stress : Rather much  Social Connections: Socially Integrated (03/26/2023)   Received from [facility], [facility]   Social Network    How would you rate your social network (family, work,  friends)?: Good participation with social networks  Intimate Partner Violence: Not At Risk (03/26/2023)   Received from [facility], [facility]   HITS    Over the last 12 months how often did your partner physically hurt you?: Never    Over the last 12 months how often did your partner insult you or talk down to you?: Never    Over the last 12 months how often did your partner threaten you with physical harm?: Never    Over the last 12 months how often did your partner scream or curse at you?: Never     PHYSICAL EXAM  GENERAL EXAM/CONSTITUTIONAL: Vitals:  There were no vitals filed for this visit.  There is no height or weight on file to calculate BMI. Wt Readings from Last 3 Encounters:  06/19/23 272 lb (123.4 kg)  03/31/23 280 lb (127 kg)  11/21/21 274 lb 14.6 oz (124.7 kg)   Patient is in no distress; well developed, nourished and groomed; neck is supple  CARDIOVASCULAR: Examination of carotid arteries is normal; no carotid bruits Regular rate and rhythm, no murmurs Examination of peripheral vascular system by observation and palpation is normal  EYES: Ophthalmoscopic exam of optic discs and posterior segments is normal; no papilledema or hemorrhages No results found.  MUSCULOSKELETAL: Gait, strength, tone, movements noted in Neurologic exam below  NEUROLOGIC: MENTAL STATUS:      No data to display         awake, alert, oriented to person, place and time recent and remote memory intact normal attention and concentration language fluent, comprehension intact, naming intact fund of knowledge appropriate  CRANIAL NERVE:  2nd - no papilledema on fundoscopic exam 2nd, 3rd, 4th, 6th - pupils equal and reactive to light, visual fields full to confrontation, extraocular muscles intact, no nystagmus 5th - facial sensation symmetric 7th - facial strength symmetric 8th - hearing intact 9th - palate elevates symmetrically, uvula midline 11th - shoulder shrug  symmetric 12th - tongue protrusion midline  MOTOR:  normal bulk and tone, full strength in the BUE, BLE  SENSORY:  normal and symmetric to light touch, temperature, vibration  COORDINATION:  finger-nose-finger, fine finger movements normal  REFLEXES:  deep tendon reflexes TRACE and symmetric  GAIT/STATION:  narrow based gait     DIAGNOSTIC DATA (LABS, IMAGING, TESTING) - I reviewed patient records, labs, notes, testing and imaging myself where available.  Lab Results  Component Value Date   WBC 8.8 11/21/2021   HGB 13.0 11/21/2021   HCT 41.1 11/21/2021   MCV 87.6 11/21/2021   PLT 264 11/21/2021      Component Value Date/Time   NA 137 11/21/2021 2119   K 3.2 (L) 11/21/2021 2119   CL 105 11/21/2021 2119   CO2 22 11/21/2021 2119   GLUCOSE 81 11/21/2021 2119   BUN 9 11/21/2021 2119   CREATININE 0.72 11/21/2021 2119   CALCIUM 8.6 (L) 11/21/2021 2119   PROT 7.9 11/21/2021 2119   ALBUMIN 4.3 11/21/2021 2119   AST 19 11/21/2021 2119   ALT 24 11/21/2021 2119   ALKPHOS 70 11/21/2021 2119   BILITOT 0.7 11/21/2021 2119   GFRNONAA >60 11/21/2021 2119   GFRAA >60 01/27/2020 0948   No results found for: "CHOL", "HDL", "LDLCALC", "LDLDIRECT", "TRIG", "CHOLHDL" No results found for: "HGBA1C" No results found for: "VITAMINB12" No results found for: "TSH"     ASSESSMENT AND PLAN  27 y.o. year old female here with:   Dx:  No diagnosis found.    PLAN:   MIGRAINE WITH AURA  Prevention: Continue topiramate 50 mg twice daily Rescue: Continue rizatriptan as needed  Meds tried: qulipta (hives), tizanidine, ibuprofen     No orders of the defined types were placed in this encounter.  No follow-ups on file.    I spent *** minutes of face-to-face and non-face-to-face time with patient.  This included previsit chart review, lab review, study review, order entry, electronic health record documentation, patient education and discussion regarding above diagnoses  and treatment plan and answered all other questions to patient's satisfaction  Ihor Austin, [facility]  [facility] [address] [place], [place] [address]  Phone [phone] Fax [phone] Note: This document was prepared with digital dictation and possible smart phrase technology. Any transcriptional errors that result from this process are unintentional.

## 2023-11-05 ENCOUNTER — Encounter: Payer: Self-pay | Admitting: Adult Health

## 2023-11-05 ENCOUNTER — Ambulatory Visit (INDEPENDENT_AMBULATORY_CARE_PROVIDER_SITE_OTHER): Payer: BC Managed Care – PPO | Admitting: Adult Health

## 2023-11-05 VITALS — BP 109/74 | HR 77 | Ht 62.0 in | Wt 294.0 lb

## 2023-11-05 DIAGNOSIS — G43109 Migraine with aura, not intractable, without status migrainosus: Secondary | ICD-10-CM | POA: Diagnosis not present

## 2023-11-05 MED ORDER — TOPIRAMATE 50 MG PO TABS: 50.0000 mg | ORAL_TABLET | Freq: Two times a day (BID) | ORAL | 5 refills | Status: DC

## 2023-11-05 MED ORDER — ONDANSETRON 8 MG PO TBDP: 8.0000 mg | ORAL_TABLET | Freq: Three times a day (TID) | ORAL | 0 refills | Status: DC | PRN

## 2023-11-05 NOTE — Patient Instructions (Addendum)
Your Plan:  Continue topiramate but increase to 50mg  twice daily - please let me know after 3-4 weeks if headaches persist, can discuss other treatment options   Start Maxalt as needed, can take additional dose after 2 hours if needed.  Do not take more than 2 tablets in a 24-hour period.  Limit use to no more than 2-3x per week or 8x per month  Start Zofran as needed for nausea associated with migraines      Follow up in 6 months or call earlier if needed     Thank you for coming to see [place] at [facility]. I hope we have been able to provide you high quality care today.  You may receive a patient satisfaction survey over the next few weeks. We would appreciate your feedback and comments so that we may continue to improve ourselves and the health of our patients.

## 2023-12-23 ENCOUNTER — Emergency Department (HOSPITAL_BASED_OUTPATIENT_CLINIC_OR_DEPARTMENT_OTHER)
Admission: EM | Admit: 2023-12-23 | Discharge: 2023-12-23 | Disposition: A | Discharge status: Home / Self Care | Payer: BC Managed Care – PPO | Attending: Emergency Medicine | Admitting: Emergency Medicine

## 2023-12-23 ENCOUNTER — Encounter (HOSPITAL_BASED_OUTPATIENT_CLINIC_OR_DEPARTMENT_OTHER): Payer: Self-pay | Admitting: Radiology

## 2023-12-23 ENCOUNTER — Other Ambulatory Visit: Payer: Self-pay

## 2023-12-23 DIAGNOSIS — R1084 Generalized abdominal pain: Secondary | ICD-10-CM | POA: Insufficient documentation

## 2023-12-23 DIAGNOSIS — R197 Diarrhea, unspecified: Secondary | ICD-10-CM | POA: Insufficient documentation

## 2023-12-23 DIAGNOSIS — Z20822 Contact with and (suspected) exposure to covid-19: Secondary | ICD-10-CM | POA: Diagnosis not present

## 2023-12-23 LAB — COMPREHENSIVE METABOLIC PANEL
ALT: 27 U/L (ref 0–44)
AST: 24 U/L (ref 15–41)
Albumin: 4 g/dL (ref 3.5–5.0)
Alkaline Phosphatase: 63 U/L (ref 38–126)
Anion gap: 5 (ref 5–15)
BUN: 9 mg/dL (ref 6–20)
CO2: 26 mmol/L (ref 22–32)
Calcium: 8.6 mg/dL — ABNORMAL LOW (ref 8.9–10.3)
Chloride: 105 mmol/L (ref 98–111)
Creatinine, Ser: 0.75 mg/dL (ref 0.44–1.00)
GFR, Estimated: >60 mL/min (ref >60)
Glucose, Bld: 101 mg/dL — ABNORMAL HIGH (ref 70–99)
Potassium: 4 mmol/L (ref 3.5–5.1)
Sodium: 136 mmol/L (ref 135–145)
Total Bilirubin: 0.6 mg/dL (ref 0.0–1.2)
Total Protein: 7.6 g/dL (ref 6.5–8.1)

## 2023-12-23 LAB — URINALYSIS, ROUTINE W REFLEX MICROSCOPIC
Bilirubin Urine: NEGATIVE
Glucose, UA: NEGATIVE mg/dL
Hgb urine dipstick: NEGATIVE
Ketones, ur: NEGATIVE mg/dL
Leukocytes,Ua: NEGATIVE
Nitrite: NEGATIVE
Protein, ur: NEGATIVE mg/dL
Specific Gravity, Urine: 1.025 (ref 1.005–1.030)
pH: 7.5 (ref 5.0–8.0)

## 2023-12-23 LAB — CBC
HCT: 41.7 % (ref 36.0–46.0)
Hemoglobin: 13.3 g/dL (ref 12.0–15.0)
MCH: 27.6 pg (ref 26.0–34.0)
MCHC: 31.9 g/dL (ref 30.0–36.0)
MCV: 86.5 fL (ref 80.0–100.0)
Platelets: 250 10*3/uL (ref 150–400)
RBC: 4.82 MIL/uL (ref 3.87–5.11)
RDW: 12.9 % (ref 11.5–15.5)
WBC: 5.5 10*3/uL (ref 4.0–10.5)
nRBC: 0 % (ref 0.0–0.2)

## 2023-12-23 LAB — RESP PANEL BY RT-PCR (RSV, FLU A&B, COVID)  RVPGX2
Influenza A by PCR: NEGATIVE
Influenza B by PCR: NEGATIVE
Resp Syncytial Virus by PCR: NEGATIVE
SARS Coronavirus 2 by RT PCR: NEGATIVE

## 2023-12-23 LAB — PREGNANCY, URINE: Preg Test, Ur: NEGATIVE

## 2023-12-23 LAB — LIPASE, BLOOD: Lipase: 43 U/L (ref 11–51)

## 2023-12-23 MED ORDER — LOPERAMIDE HCL 2 MG PO CAPS: 2.0000 mg | ORAL_CAPSULE | Freq: Four times a day (QID) | ORAL | 0 refills | Status: DC | PRN

## 2023-12-23 MED ORDER — DICYCLOMINE HCL 20 MG PO TABS: 20.0000 mg | ORAL_TABLET | Freq: Three times a day (TID) | ORAL | 0 refills | Status: DC | PRN

## 2023-12-23 MED ORDER — DICYCLOMINE HCL 10 MG PO CAPS
10.0000 mg | ORAL_CAPSULE | Freq: Once | ORAL | Status: AC
  Administered 2023-12-23: 10 mg via ORAL
  Filled 2023-12-23: qty 1

## 2023-12-23 MED ORDER — LOPERAMIDE HCL 2 MG PO CAPS
4.0000 mg | ORAL_CAPSULE | Freq: Once | ORAL | Status: AC
  Administered 2023-12-23: 4 mg via ORAL
  Filled 2023-12-23: qty 2

## 2023-12-23 MED ORDER — ONDANSETRON 4 MG PO TBDP
4.0000 mg | ORAL_TABLET | Freq: Three times a day (TID) | ORAL | 0 refills | Status: DC | PRN
Start: 2023-12-23 — End: 2024-09-28

## 2023-12-23 NOTE — ED Provider Notes (Signed)
 Emergency Department Provider Note   I have reviewed the triage vital signs and the nursing notes.   HISTORY  Chief Complaint Diarrhea   HPI Kwanza Cancelliere is a 28 y.o. female with past history reviewed below including GERD and reported history of IBS presents to the emergency department with diarrhea over the past 2 weeks with associated abdominal cramping and burning sensation.  No bright red blood in the bowel movements.  She denies any travel or recent hospitalizations.  No recent antibiotics.  She has had some similar symptoms with IBS in the past but reports this is more severe than prior episodes. No fever or chills.   Past Medical History:  Diagnosis Date   GERD (gastroesophageal reflux disease)    Macromastia 09/2018   Migraines    Morbid obesity (HCC)     Review of Systems  Constitutional: No fever/chills Cardiovascular: Denies chest pain. Respiratory: Denies shortness of breath. Gastrointestinal: Positive abdominal pain.  No nausea, no vomiting. Positive diarrhea.  No constipation. Genitourinary: Negative for dysuria. Musculoskeletal: Negative for back pain. Skin: Negative for rash.  ____________________________________________   PHYSICAL EXAM:  VITAL SIGNS: ED Triage Vitals  Encounter Vitals Group     BP 12/23/23 1616 (!) 140/95     Pulse Rate 12/23/23 1616 79     Resp 12/23/23 1616 18     Temp 12/23/23 1616 98.2 F (36.8 C)     Temp src --      SpO2 12/23/23 1616 100 %     Weight 12/23/23 1613 292 lb 9.6 oz (132.7 kg)     Height 12/23/23 1613 5' 2 (1.575 m)   Constitutional: Alert and oriented. Well appearing and in no acute distress. Eyes: Conjunctivae are normal.  Head: Atraumatic. Nose: No congestion/rhinnorhea. Mouth/Throat: Mucous membranes are moist.  Neck: No stridor.   Cardiovascular: Normal rate, regular rhythm. Good peripheral circulation. Grossly normal heart sounds.   Respiratory: Normal respiratory effort.  No retractions. Lungs  CTAB. Gastrointestinal: Soft and nontender. No distention.  Musculoskeletal: No lower extremity tenderness nor edema. No gross deformities of extremities. Neurologic:  Normal speech and language. Skin:  Skin is warm, dry and intact. No rash noted.   ____________________________________________   LABS (all labs ordered are listed, but only abnormal results are displayed)  Labs Reviewed  COMPREHENSIVE METABOLIC PANEL - Abnormal; Notable for the following components:      Result Value   Glucose, Bld 101 (*)    Calcium 8.6 (*)    All other components within normal limits  URINALYSIS, ROUTINE W REFLEX MICROSCOPIC - Abnormal; Notable for the following components:   APPearance HAZY (*)    All other components within normal limits  RESP PANEL BY RT-PCR (RSV, FLU A&B, COVID)  RVPGX2  C DIFFICILE QUICK SCREEN W PCR REFLEX    GASTROINTESTINAL PANEL BY PCR, STOOL (REPLACES STOOL CULTURE)  LIPASE, BLOOD  CBC  PREGNANCY, URINE    ____________________________________________   PROCEDURES  Procedure(s) performed:   Procedures  None  ____________________________________________   INITIAL IMPRESSION / ASSESSMENT AND PLAN / ED COURSE  Pertinent labs & imaging results that were available during my care of the patient were reviewed by me and considered in my medical decision making (see chart for details).   This patient is Presenting for Evaluation of abdominal pain, which does require a range of treatment options, and is a complaint that involves a high risk of morbidity and mortality.  The Differential Diagnoses includes but is not exclusive to acute  cholecystitis, intrathoracic causes for epigastric abdominal pain, gastritis, duodenitis, pancreatitis, small bowel or large bowel obstruction, abdominal aortic aneurysm, hernia, gastritis, etc.   Critical Interventions-    Medications  loperamide  (IMODIUM ) capsule 4 mg (4 mg Oral Given 12/23/23 2028)  dicyclomine  (BENTYL ) capsule 10  mg (10 mg Oral Given 12/23/23 2028)    Reassessment after intervention:  symptoms improved.    Clinical Laboratory Tests Ordered, included pregnancy negative.  Viral panel negative.  No UTI.  CBC without leukocytosis or anemia.  No acute kidney injury.  LFTs, bilirubin, lipase are normal.  Radiologic Tests: Considered CT abdomen pelvis but patient with diffusely soft, nontender abdomen.  Normal vital signs and reassuring labs.  Defer imaging for now.  Cardiac Monitor Tracing which shows NSR.    Social Determinants of Health Risk patient is a non-smoker.   Medical Decision Making: Summary:  Patient presents emergency department with diarrhea and cramping abdominal pain.  Labs are reassuring.  Vital signs stable.  Low risk for C. difficile.  Will attempt to send a sample for testing but this will come back in the MyChart app if the patient is able to provide a sample.  Reevaluation with update and discussion with patient. Plan for PCP follow up and symptom mgmt at home.   Patient's presentation is most consistent with acute, uncomplicated illness.   Disposition: discharge  ____________________________________________  FINAL CLINICAL IMPRESSION(S) / ED DIAGNOSES  Final diagnoses:  Diarrhea, unspecified type  Generalized abdominal pain     NEW OUTPATIENT MEDICATIONS STARTED DURING THIS VISIT:  New Prescriptions   DICYCLOMINE  (BENTYL ) 20 MG TABLET    Take 1 tablet (20 mg total) by mouth 3 (three) times daily as needed.   LOPERAMIDE  (IMODIUM ) 2 MG CAPSULE    Take 1 capsule (2 mg total) by mouth 4 (four) times daily as needed for diarrhea or loose stools.   ONDANSETRON  (ZOFRAN -ODT) 4 MG DISINTEGRATING TABLET    Take 1 tablet (4 mg total) by mouth every 8 (eight) hours as needed.    Note:  This document was prepared using Dragon voice recognition software and may include unintentional dictation errors.  Fonda Law, MD, [facility]    Adlynn Lowenstein, Fonda MATSU, MD 12/23/23  2035

## 2023-12-23 NOTE — ED Triage Notes (Signed)
 Pt states her abd started burning on Sunday and she has had diarrhea since Wednesday. Pt states she started getting auras like she was going to get a migraine yesterday and decided she should come and get checked.

## 2023-12-23 NOTE — Discharge Instructions (Addendum)
 You were seen in the emerged from today with diarrhea.  We have sent stool studies but those will not come back until tomorrow at the earliest.  Please follow the results in the MyChart app and discuss further with your primary care doctor.  I am calling in some medication to help with your symptoms. Return with any new or worsening symptoms.

## 2023-12-24 LAB — GASTROINTESTINAL PANEL BY PCR, STOOL (REPLACES STOOL CULTURE)

## 2024-01-14 ENCOUNTER — Other Ambulatory Visit (HOSPITAL_BASED_OUTPATIENT_CLINIC_OR_DEPARTMENT_OTHER): Payer: Self-pay | Admitting: Surgery

## 2024-01-14 DIAGNOSIS — R1011 Right upper quadrant pain: Secondary | ICD-10-CM

## 2024-01-14 DIAGNOSIS — R5381 Other malaise: Secondary | ICD-10-CM

## 2024-01-16 ENCOUNTER — Other Ambulatory Visit (HOSPITAL_COMMUNITY): Payer: BC Managed Care – PPO

## 2024-01-16 ENCOUNTER — Inpatient Hospital Stay (HOSPITAL_COMMUNITY)
Admission: RE | Admit: 2024-01-16 | Discharge: 2024-01-16 | Disposition: A | Discharge status: Home / Self Care | Payer: BC Managed Care – PPO | Source: Ambulatory Visit | Attending: Surgery | Admitting: Surgery

## 2024-01-18 ENCOUNTER — Ambulatory Visit (HOSPITAL_COMMUNITY)
Admission: RE | Admit: 2024-01-18 | Discharge: 2024-01-18 | Disposition: A | Discharge status: Home / Self Care | Source: Ambulatory Visit | Attending: Surgery | Admitting: Surgery

## 2024-01-18 ENCOUNTER — Ambulatory Visit (HOSPITAL_COMMUNITY)
Admission: RE | Admit: 2024-01-18 | Discharge: 2024-01-18 | Discharge status: Home / Self Care | Source: Ambulatory Visit | Attending: Surgery | Admitting: Surgery

## 2024-01-18 ENCOUNTER — Other Ambulatory Visit: Payer: Self-pay

## 2024-01-18 DIAGNOSIS — R5381 Other malaise: Secondary | ICD-10-CM

## 2024-01-18 DIAGNOSIS — R1011 Right upper quadrant pain: Secondary | ICD-10-CM | POA: Diagnosis present

## 2024-01-18 DIAGNOSIS — R5383 Other fatigue: Secondary | ICD-10-CM | POA: Insufficient documentation

## 2024-05-04 NOTE — Progress Notes (Deleted)
 [facility]  PATIENT: Kelsey Rojas DOB: June 08, 1996  REFERRING CLINICIAN: Bertrum Brodie, MD HISTORY FROM: patient  REASON FOR VISIT: Migraine headaches   HISTORICAL  CHIEF COMPLAINT:  No chief complaint on file.   HISTORY OF PRESENT ILLNESS:    Update 05/05/2024 JM: Patient returns for follow-up visit.   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  At prior visit, she was started on topiramate  with gradual titration to 50 mg twice daily for prevention and rizatriptan  for rescue.  Patient reports no significant improvement of migraine headaches. Reports 8 migraines last month and 4 so far this month (although at prior visit reported 3-4 headaches per week).  She is currently on topiramate  25 mg twice daily, never increased to 50 mg twice daily.  She is tolerating current dose well.  She never started rizatriptan , just recently obtained prescription.  Has been using OTC pain relievers without much benefit, denies overuse.  Migraines can be associated with photophobia, phonophobia and nausea/vomiting, can occasionally have visual aura prior to migraines.  Never tried Zofran  as previously prescribed.  She has noticed fluorescent lights and prolonged screen time can trigger headaches, has recently been using bluelight glasses which have helped some.    Consult visit 03/31/2023 Dr. Salli Rojas: 28 year old female here for evaluation of headaches.  Onset of headaches around 2008 with left-sided throbbing sensation, pain in the eyes, nausea vomiting, sensitive to light and sound, sometimes associated with visual aura and flashes.  Headaches have gradually increased over time.  Now having 3-4 headaches per week.  Strong family history of migraine on mother side.  Tried Qulipta but this caused hives after 2 weeks.  Tried ibuprofen  with mild relief.  Planning to start topiramate  soon for  weight loss.   REVIEW OF SYSTEMS: Full 14 system review of systems performed and negative with exception of: as per HPI.  ALLERGIES: Allergies  Allergen Reactions   Penicillins Rash and Hives    Has patient had a PCN reaction causing immediate rash, facial/tongue/throat swelling, SOB or lightheadedness with hypotension: No Has patient had a PCN reaction causing severe rash involving mucus membranes or skin necrosis: No Has patient had a PCN reaction that required hospitalization: No Has patient had a PCN reaction occurring within the last 10 years: #  #  #  YES  #  #  #  If all of the above answers are NO, then may proceed with Cephalosporin use.  Has patient had a PCN reaction causing immediate rash, facial/tongue/throat swelling, SOB or lightheadedness with hypotension: No Has patient had a PCN reaction causing severe rash involving mucus membranes or skin necrosis: No Has patient had a PCN reaction that required hospitalization: No Has patient had a PCN reaction occurring within the last 10 years: #  #  #  YES  #  #  #  If all of the above answers are NO, then may proceed with Cephalosporin use.   Ciprofloxacin Hives    Family history of a reaction UNSPECIFIED FAMILY REACTION Family history of a reaction UNSPECIFIED FAMILY REACTION   Amoxicillin  Rash   Qulipta [Atogepant] Hives and Rash    HOME MEDICATIONS: Outpatient Medications Prior to Visit  Medication Sig Dispense Refill   dicyclomine  (BENTYL ) 20 MG tablet Take 1 tablet (20 mg total) by mouth 3 (three) times daily as needed. 20 tablet 0   ibuprofen  (ADVIL ) 800 MG tablet Take 1  tablet (800 mg total) by mouth every 8 (eight) hours as needed. 21 tablet 0   loperamide  (IMODIUM ) 2 MG capsule Take 1 capsule (2 mg total) by mouth 4 (four) times daily as needed for diarrhea or loose stools. 12 capsule 0   omeprazole (PRILOSEC) 20 MG capsule Take 20 mg by mouth daily.     ondansetron  (ZOFRAN -ODT) 4 MG disintegrating tablet Take  1 tablet (4 mg total) by mouth every 8 (eight) hours as needed. 20 tablet 0   phentermine 30 MG capsule Take 30 mg by mouth every morning.     rizatriptan  (MAXALT -MLT) 10 MG disintegrating tablet Take 1 tablet (10 mg total) by mouth as needed for migraine. May repeat in 2 hours if needed 9 tablet 11   topiramate  (TOPAMAX ) 50 MG tablet Take 1 tablet (50 mg total) by mouth 2 (two) times daily. 60 tablet 5   No facility-administered medications prior to visit.    PAST MEDICAL HISTORY: Past Medical History:  Diagnosis Date   GERD (gastroesophageal reflux disease)    Macromastia 09/2018   Migraines    Morbid obesity (HCC)     PAST SURGICAL HISTORY: Past Surgical History:  Procedure Laterality Date   BREAST REDUCTION SURGERY Bilateral 10/12/2018   Procedure: BILATERAL MAMMARY REDUCTION  (BREAST);  Surgeon: Contogiannis, Kevon Pellegrini, MD;  Location: [facility] OR;  Service: Plastics;  Laterality: Bilateral;  BILATERAL MAMMARY REDUCTION  (BREAST)   CHOLECYSTECTOMY, LAPAROSCOPIC  04/10/2022   UPPER GASTROINTESTINAL ENDOSCOPY      FAMILY HISTORY: Family History  Problem Relation Age of Onset   Depression Mother    Anxiety disorder Mother    Hypertension Mother    Migraines Mother    Cancer Father    Hypertension Father    Migraines Paternal Grandmother    Alcohol abuse Paternal Grandfather     SOCIAL HISTORY: Social History   Socioeconomic History   Marital status: Single    Spouse name: Not on file   Number of children: 0   Years of education: Not on file   Highest education level: Bachelor's degree (e.g., BA, AB, BS)  Occupational History   Not on file  Tobacco Use   Smoking status: Never   Smokeless tobacco: Never  Vaping Use   Vaping status: Never Used  Substance and Sexual Activity   Alcohol use: Yes    Alcohol/week: 1.0 standard drink of alcohol    Types: 1 Glasses of wine per week    Comment: occasionally   Drug use: No   Sexual activity: Yes    Birth  control/protection: None  Other Topics Concern   Not on file  Social History Narrative   Pt lives alone    Pt works    Social Drivers of Corporate investment banker Strain: Low Risk  (03/26/2023)   Received from [facility]   Overall Financial Resource Strain (CARDIA)    Difficulty of Paying Living Expenses: Not very hard  Food Insecurity: No Food Insecurity (03/26/2023)   [facility] from [facility]   Hunger Vital Sign    Worried About Running Out of Food in the Last Year: Never true    Ran Out of Food in the Last Year: Never true  Transportation Needs: No Transportation Needs (03/26/2023)   Received from [facility] - Transportation    Lack of Transportation (Medical): No    Lack of Transportation (Non-Medical): No  Physical Activity: Insufficiently Active (03/26/2023)   Received from [facility]  Exercise Vital Sign    Days of Exercise per Week: 3 days    Minutes of Exercise per Session: 30 min  Stress: Stress Concern Present (03/26/2023)   Received from [facility] - Occupational Stress Questionnaire    Feeling of Stress : Rather much  Social Connections: Socially Integrated (03/26/2023)   Received from [facility]   Social Network    How would you rate your social network (family, work, friends)?: Good participation with social networks  Intimate Partner Violence: Not At Risk (03/26/2023)   Received from [facility]   HITS    Over the last 12 months how often did your partner physically hurt you?: Never    Over the last 12 months how often did your partner insult you or talk down to you?: Never    Over the last 12 months how often did your partner threaten you with physical harm?: Never    Over the last 12 months how often did your partner scream or curse at you?: Never     PHYSICAL EXAM  GENERAL EXAM/CONSTITUTIONAL: Vitals:  There were no vitals filed for this visit.   There is no height or weight on file  to calculate BMI. Wt Readings from Last 3 Encounters:  12/23/23 292 lb 9.6 oz (132.7 kg)  11/05/23 294 lb (133.4 kg)  06/19/23 272 lb (123.4 kg)   Patient is in no distress; very pleasant young African-American female, well developed, nourished and groomed; neck is supple  CARDIOVASCULAR: Examination of carotid arteries is normal; no carotid bruits Regular rate and rhythm, no murmurs Examination of peripheral vascular system by observation and palpation is normal  EYES: Ophthalmoscopic exam of optic discs and posterior segments is normal; no papilledema or hemorrhages  MUSCULOSKELETAL: Gait, strength, tone, movements noted in Neurologic exam below  NEUROLOGIC: MENTAL STATUS:  awake, alert, oriented to person, place and time recent and remote memory intact normal attention and concentration language fluent, comprehension intact, naming intact fund of knowledge appropriate  CRANIAL NERVE:  2nd - no papilledema on fundoscopic exam 2nd, 3rd, 4th, 6th - pupils equal and reactive to light, visual fields full to confrontation, extraocular muscles intact, no nystagmus 5th - facial sensation symmetric 7th - facial strength symmetric 8th - hearing intact 9th - palate elevates symmetrically, uvula midline 11th - shoulder shrug symmetric 12th - tongue protrusion midline  MOTOR:  normal bulk and tone, full strength in the BUE, BLE  SENSORY:  normal and symmetric to light touch, temperature, vibration  COORDINATION:  finger-nose-finger, fine finger movements normal  REFLEXES:  deep tendon reflexes TRACE and symmetric  GAIT/STATION:  narrow based gait     DIAGNOSTIC DATA (LABS, IMAGING, TESTING) - I reviewed patient records, labs, notes, testing and imaging myself where available.  Lab Results  Component Value Date   WBC 5.5 12/23/2023   HGB 13.3 12/23/2023   HCT 41.7 12/23/2023   MCV 86.5 12/23/2023   PLT 250 12/23/2023      Component Value Date/Time   NA 136  12/23/2023 1615   K 4.0 12/23/2023 1615   CL 105 12/23/2023 1615   CO2 26 12/23/2023 1615   GLUCOSE 101 (H) 12/23/2023 1615   BUN 9 12/23/2023 1615   CREATININE 0.75 12/23/2023 1615   CALCIUM 8.6 (L) 12/23/2023 1615   PROT 7.6 12/23/2023 1615   ALBUMIN  4.0 12/23/2023 1615   AST 24 12/23/2023 1615   ALT 27 12/23/2023 1615   ALKPHOS 63 12/23/2023  1615   BILITOT 0.6 12/23/2023 1615   GFRNONAA >60 12/23/2023 1615   GFRAA >60 01/27/2020 0948   No results found for: CHOL, HDL, LDLCALC, LDLDIRECT, TRIG, CHOLHDL No results found for: JYNW2N No results found for: VITAMINB12 No results found for: TSH     ASSESSMENT AND PLAN  28 y.o. year old female here with:   Dx:  No diagnosis found.     PLAN:   MIGRAINE WITH AURA  Prevention: Increase topiramate  to 50 mg twice daily Rescue: Start rizatriptan  as needed, can repeat times 2:01 hours if needed.  Advised not to use more than 2-3 times per week or 8 times per month.  Start Zofran  as needed for nausea associated with migraines.   Meds tried: qulipta (hives), topiramate , rizatriptan , tizanidine, ibuprofen , Excedrin, acetaminophen      No orders of the defined types were placed in this encounter.  No follow-ups on file.    I personally spent a total of *** minutes in the care of the patient today including {Time Based Coding:[id number]}.   Johny Nap, AGNP-BC  [facility] [address] [place], [place] [address]  Phone [phone] Fax [phone] Note: This document was prepared with digital dictation and possible smart phrase technology. Any transcriptional errors that result from this process are unintentional.

## 2024-05-05 ENCOUNTER — Ambulatory Visit: Payer: BC Managed Care – PPO | Admitting: Adult Health

## 2024-05-17 ENCOUNTER — Telehealth: Admitting: Diagnostic Neuroimaging

## 2024-05-26 ENCOUNTER — Telehealth: Payer: Self-pay | Admitting: Diagnostic Neuroimaging

## 2024-05-26 NOTE — Telephone Encounter (Signed)
 LVM and sent mychart msg offering sooner appt with Dr. Margaret  If patient calls back you can offer a slot either tomorrow or Monday-Thursday next week with Dr Margaret

## 2024-05-27 ENCOUNTER — Telehealth (INDEPENDENT_AMBULATORY_CARE_PROVIDER_SITE_OTHER): Admitting: Diagnostic Neuroimaging

## 2024-05-27 ENCOUNTER — Encounter: Payer: Self-pay | Admitting: Diagnostic Neuroimaging

## 2024-05-27 DIAGNOSIS — G43109 Migraine with aura, not intractable, without status migrainosus: Secondary | ICD-10-CM

## 2024-05-27 DIAGNOSIS — G4709 Other insomnia: Secondary | ICD-10-CM | POA: Diagnosis not present

## 2024-05-27 DIAGNOSIS — G4733 Obstructive sleep apnea (adult) (pediatric): Secondary | ICD-10-CM | POA: Diagnosis not present

## 2024-05-27 MED ORDER — RIZATRIPTAN BENZOATE 10 MG PO TBDP: 10.0000 mg | ORAL_TABLET | ORAL | 11 refills | Status: AC | PRN

## 2024-05-27 MED ORDER — AMITRIPTYLINE HCL 25 MG PO TABS: 25.0000 mg | ORAL_TABLET | Freq: Every day | ORAL | 3 refills | Status: DC

## 2024-05-27 NOTE — Progress Notes (Signed)
 [facility]  PATIENT: Kelsey Rojas DOB: 17-Sep-1996  REFERRING CLINICIAN: Claudene Round, MD HISTORY FROM: patient  REASON FOR VISIT: follow up   HISTORICAL  CHIEF COMPLAINT:  Chief Complaint  Patient presents with   Migraine    HISTORY OF PRESENT ILLNESS:   UPDATE (05/27/24, VRP): Since last visit, doing slightly better. Had bariatric surgery and HA were better post op from April to May 2025. Rizatriptan  helps reduce HA pain. Topiramate  tried for 8 months, not helping, so she stopped it. Some mild insomnia issues. Has OSA, on CPAP.  UPDATE (11/04/2023 JM): Patient returns for migraine follow-up visit.  At prior visit, she was started on topiramate  with gradual titration to 50 mg twice daily for prevention and rizatriptan  for rescue.   Patient reports no significant improvement of migraine headaches. Reports 8 migraines last month and 4 so far this month (although at prior visit reported 3-4 headaches per week).  She is currently on topiramate  25 mg twice daily, never increased to 50 mg twice daily.  She is tolerating current dose well.  She never started rizatriptan , just recently obtained prescription.  Has been using OTC pain relievers without much benefit, denies overuse.  Migraines can be associated with photophobia, phonophobia and nausea/vomiting, can occasionally have visual aura prior to migraines.  Never tried Zofran  as previously prescribed.  She has noticed fluorescent lights and prolonged screen time can trigger headaches, has recently been using bluelight glasses which have helped some.  PRIOR HPI (03/31/23): 28 year old female here for evaluation of headaches.  Onset of headaches around 2008 with left-sided throbbing sensation, pain in the eyes, nausea vomiting, sensitive to light and sound, sometimes associated with visual aura and flashes.  Headaches have gradually increased over time.  Now having 3-4 headaches per week.  Strong family history of migraine  on mother side.  Tried Qulipta but this caused hives after 2 weeks.  Tried ibuprofen  with mild relief.  Planning to start topiramate  soon for weight loss.   REVIEW OF SYSTEMS: Full 14 system review of systems performed and negative with exception of: as per HPI.  ALLERGIES: Allergies  Allergen Reactions   Penicillins Rash and Hives    Has patient had a PCN reaction causing immediate rash, facial/tongue/throat swelling, SOB or lightheadedness with hypotension: No Has patient had a PCN reaction causing severe rash involving mucus membranes or skin necrosis: No Has patient had a PCN reaction that required hospitalization: No Has patient had a PCN reaction occurring within the last 10 years: #  #  #  YES  #  #  #  If all of the above answers are NO, then may proceed with Cephalosporin use.  Has patient had a PCN reaction causing immediate rash, facial/tongue/throat swelling, SOB or lightheadedness with hypotension: No Has patient had a PCN reaction causing severe rash involving mucus membranes or skin necrosis: No Has patient had a PCN reaction that required hospitalization: No Has patient had a PCN reaction occurring within the last 10 years: #  #  #  YES  #  #  #  If all of the above answers are NO, then may proceed with Cephalosporin use.   Ciprofloxacin Hives    Family history of a reaction UNSPECIFIED FAMILY REACTION Family history of a reaction UNSPECIFIED FAMILY REACTION   Amoxicillin  Rash   Qulipta [Atogepant] Hives and Rash    HOME MEDICATIONS: Outpatient Medications Prior to Visit  Medication Sig Dispense Refill   ondansetron  (ZOFRAN -ODT) 4 MG disintegrating tablet  Take 1 tablet (4 mg total) by mouth every 8 (eight) hours as needed. 20 tablet 0   rizatriptan  (MAXALT -MLT) 10 MG disintegrating tablet Take 1 tablet (10 mg total) by mouth as needed for migraine. May repeat in 2 hours if needed 9 tablet 11   dicyclomine  (BENTYL ) 20 MG tablet Take 1 tablet (20 mg total) by mouth  3 (three) times daily as needed. (Patient not taking: Reported on 05/27/2024) 20 tablet 0   ibuprofen  (ADVIL ) 800 MG tablet Take 1 tablet (800 mg total) by mouth every 8 (eight) hours as needed. (Patient not taking: Reported on 05/27/2024) 21 tablet 0   loperamide  (IMODIUM ) 2 MG capsule Take 1 capsule (2 mg total) by mouth 4 (four) times daily as needed for diarrhea or loose stools. (Patient not taking: Reported on 05/27/2024) 12 capsule 0   omeprazole (PRILOSEC) 20 MG capsule Take 20 mg by mouth daily. (Patient not taking: Reported on 05/27/2024)     phentermine 30 MG capsule Take 30 mg by mouth every morning. (Patient not taking: Reported on 05/27/2024)     topiramate  (TOPAMAX ) 50 MG tablet Take 1 tablet (50 mg total) by mouth 2 (two) times daily. (Patient not taking: Reported on 05/27/2024) 60 tablet 5   No facility-administered medications prior to visit.    PAST MEDICAL HISTORY: Past Medical History:  Diagnosis Date   GERD (gastroesophageal reflux disease)    Macromastia 09/2018   Migraines    Morbid obesity (HCC)     PAST SURGICAL HISTORY: Past Surgical History:  Procedure Laterality Date   BREAST REDUCTION SURGERY Bilateral 10/12/2018   Procedure: BILATERAL MAMMARY REDUCTION  (BREAST);  Surgeon: Contogiannis, Ronal Caldron, MD;  Location: [facility] OR;  Service: Plastics;  Laterality: Bilateral;  BILATERAL MAMMARY REDUCTION  (BREAST)   CHOLECYSTECTOMY, LAPAROSCOPIC  04/10/2022   UPPER GASTROINTESTINAL ENDOSCOPY      FAMILY HISTORY: Family History  Problem Relation Age of Onset   Depression Mother    Anxiety disorder Mother    Hypertension Mother    Migraines Mother    Cancer Father    Hypertension Father    Migraines Paternal Grandmother    Alcohol abuse Paternal Grandfather     SOCIAL HISTORY: Social History   Socioeconomic History   Marital status: Single    Spouse name: Not on file   Number of children: 0   Years of education: Not on file   Highest education level: Bachelor's  degree (e.g., BA, AB, BS)  Occupational History   Not on file  Tobacco Use   Smoking status: Never   Smokeless tobacco: Never  Vaping Use   Vaping status: Never Used  Substance and Sexual Activity   Alcohol use: Yes    Alcohol/week: 1.0 standard drink of alcohol    Types: 1 Glasses of wine per week    Comment: occasionally   Drug use: No   Sexual activity: Yes    Birth control/protection: None  Other Topics Concern   Not on file  Social History Narrative   Pt lives alone    Pt works    Social Drivers of Corporate investment banker Strain: Medium Risk (03/23/2024)   Received from [facility]   Overall Financial Resource Strain (CARDIA)    Difficulty of Paying Living Expenses: Somewhat hard  Food Insecurity: No Food Insecurity (03/23/2024)   Received from [facility]   Hunger Vital Sign    Within the past 12 months, you worried that your food would run out before  you got the money to buy more.: Never true    Within the past 12 months, the food you bought just didn't last and you didn't have money to get more.: Never true  Transportation Needs: No Transportation Needs (03/23/2024)   Received from [facility]   PRAPARE - Transportation    Lack of Transportation (Medical): No    Lack of Transportation (Non-Medical): No  Physical Activity: Insufficiently Active (03/23/2024)   Received from [facility]   Exercise Vital Sign    On average, how many days per week do you engage in moderate to strenuous exercise (like a brisk walk)?: 3 days    On average, how many minutes do you engage in exercise at this level?: 30 min  Stress: No Stress Concern Present (03/23/2024)   Received from [facility] - Occupational Stress Questionnaire    Feeling of Stress : Only a little  Social Connections: Moderately Integrated (03/23/2024)   Received from [facility]   Social Network    How would you rate your social network (family, work, friends)?:  Adequate participation with social networks  Intimate Partner Violence: Not At Risk (03/23/2024)   Received from [facility]   HITS    Over the last 12 months how often did your partner physically hurt you?: Never    Over the last 12 months how often did your partner insult you or talk down to you?: Never    Over the last 12 months how often did your partner threaten you with physical harm?: Never    Over the last 12 months how often did your partner scream or curse at you?: Never     PHYSICAL EXAM  GENERAL EXAM/CONSTITUTIONAL: Vitals:  There were no vitals filed for this visit.  There is no height or weight on file to calculate BMI. Wt Readings from Last 3 Encounters:  12/23/23 292 lb 9.6 oz (132.7 kg)  11/05/23 294 lb (133.4 kg)  06/19/23 272 lb (123.4 kg)   Patient is in no distress; well developed, nourished and groomed; neck is supple  CARDIOVASCULAR: Examination of carotid arteries is normal; no carotid bruits Regular rate and rhythm, no murmurs Examination of peripheral vascular system by observation and palpation is normal  EYES: Ophthalmoscopic exam of optic discs and posterior segments is normal; no papilledema or hemorrhages No results found.  MUSCULOSKELETAL: Gait, strength, tone, movements noted in Neurologic exam below  NEUROLOGIC: MENTAL STATUS:      No data to display         awake, alert, oriented to person, place and time recent and remote memory intact normal attention and concentration language fluent, comprehension intact, naming intact fund of knowledge appropriate  CRANIAL NERVE:  2nd - no papilledema on fundoscopic exam 2nd, 3rd, 4th, 6th - pupils equal and reactive to light, visual fields full to confrontation, extraocular muscles intact, no nystagmus 5th - facial sensation symmetric 7th - facial strength symmetric 8th - hearing intact 9th - palate elevates symmetrically, uvula midline 11th - shoulder shrug symmetric 12th - tongue  protrusion midline  MOTOR:  normal bulk and tone, full strength in the BUE, BLE  SENSORY:  normal and symmetric to light touch, temperature, vibration  COORDINATION:  finger-nose-finger, fine finger movements normal  REFLEXES:  deep tendon reflexes TRACE and symmetric  GAIT/STATION:  narrow based gait     DIAGNOSTIC DATA (LABS, IMAGING, TESTING) - I reviewed patient records, labs, notes, testing and imaging myself where available.  Lab Results  Component Value Date   WBC 5.5 12/23/2023   HGB 13.3 12/23/2023   HCT 41.7 12/23/2023   MCV 86.5 12/23/2023   PLT 250 12/23/2023      Component Value Date/Time   NA 136 12/23/2023 1615   K 4.0 12/23/2023 1615   CL 105 12/23/2023 1615   CO2 26 12/23/2023 1615   GLUCOSE 101 (H) 12/23/2023 1615   BUN 9 12/23/2023 1615   CREATININE 0.75 12/23/2023 1615   CALCIUM 8.6 (L) 12/23/2023 1615   PROT 7.6 12/23/2023 1615   ALBUMIN  4.0 12/23/2023 1615   AST 24 12/23/2023 1615   ALT 27 12/23/2023 1615   ALKPHOS 63 12/23/2023 1615   BILITOT 0.6 12/23/2023 1615   GFRNONAA >60 12/23/2023 1615   GFRAA >60 01/27/2020 0948   No results found for: CHOL, HDL, LDLCALC, LDLDIRECT, TRIG, CHOLHDL No results found for: YHAJ8R No results found for: VITAMINB12 No results found for: TSH     ASSESSMENT AND PLAN  28 y.o. year old female here with:  Meds tried: qulipta (hives), tizanidine, ibuprofen , topiramate  (not effective)   Dx:  1. Migraine with aura and without status migrainosus, not intractable   2. Other insomnia   3. OSA on CPAP       PLAN:   MIGRAINE WITH AURA  MIGRAINE TREATMENT PLAN:  MIGRAINE PREVENTION  -Eat and sleep on a regular schedule -Exercise several times per week - start amitriptyline  25mg  at bedtime  MIGRAINE RESCUE  - ibuprofen , tylenol  as needed - continue rizatriptan  (Maxalt ) 10mg  as needed for breakthrough headache; may repeat x 1 after 2 hours; max 2 tabs per day or 8 per  month  Avoid CGRP antagonists (due to hives after qulipta)   Meds ordered this encounter  Medications   rizatriptan  (MAXALT -MLT) 10 MG disintegrating tablet    Sig: Take 1 tablet (10 mg total) by mouth as needed for migraine. May repeat in 2 hours if needed    Dispense:  9 tablet    Refill:  11   amitriptyline  (ELAVIL ) 25 MG tablet    Sig: Take 1 tablet (25 mg total) by mouth at bedtime.    Dispense:  30 tablet    Refill:  3   Return in about 6 months (around 11/27/2024) for MyChart visit (15 min).  Virtual Visit via Video Note  I connected with Kelsey Rojas on 05/27/2024 at  8:30 AM EDT by a video enabled telemedicine application and verified that I am speaking with the correct person using two identifiers.   I discussed the limitations of evaluation and management by telemedicine and the availability of in person appointments. The patient expressed understanding and agreed to proceed.  Patient is at home and I am at the office.   I spent 25 minutes of face-to-face and non-face-to-face time with patient.  This included previsit chart review, lab review, study review, order entry, electronic health record documentation, patient education.      EDUARD FABIENE HANLON, MD 05/27/2024, 8:58 AM Certified in Neurology, Neurophysiology and Neuroimaging  [facility] [address] [place], [place] [address] [phone]

## 2024-05-27 NOTE — Patient Instructions (Signed)
 MIGRAINE PREVENTION  -Eat and sleep on a regular schedule -Exercise several times per week - start amitriptyline  25mg  at bedtime  MIGRAINE RESCUE  - ibuprofen , tylenol  as needed - continue rizatriptan  (Maxalt ) 10mg  as needed for breakthrough headache; may repeat x 1 after 2 hours; max 2 tabs per day or 8 per month

## 2024-06-13 ENCOUNTER — Telehealth (HOSPITAL_COMMUNITY): Payer: Self-pay | Admitting: Licensed Clinical Social Worker

## 2024-06-13 NOTE — Telephone Encounter (Signed)
 See call intake

## 2024-06-14 ENCOUNTER — Other Ambulatory Visit (HOSPITAL_COMMUNITY): Attending: Psychiatry | Admitting: Licensed Clinical Social Worker

## 2024-06-14 DIAGNOSIS — F331 Major depressive disorder, recurrent, moderate: Secondary | ICD-10-CM | POA: Diagnosis not present
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
## 2024-06-15 ENCOUNTER — Encounter (HOSPITAL_COMMUNITY): Payer: Self-pay | Admitting: Psychiatry

## 2024-06-15 ENCOUNTER — Encounter (HOSPITAL_COMMUNITY): Payer: Self-pay

## 2024-06-15 ENCOUNTER — Other Ambulatory Visit (HOSPITAL_COMMUNITY): Attending: Psychiatry | Admitting: Psychiatry

## 2024-06-15 DIAGNOSIS — F411 Generalized anxiety disorder: Secondary | ICD-10-CM | POA: Insufficient documentation

## 2024-06-15 DIAGNOSIS — Z79899 Other long term (current) drug therapy: Secondary | ICD-10-CM | POA: Diagnosis not present

## 2024-06-15 DIAGNOSIS — F331 Major depressive disorder, recurrent, moderate: Secondary | ICD-10-CM | POA: Diagnosis present

## 2024-06-15 MED ORDER — SERTRALINE HCL 25 MG PO TABS
ORAL_TABLET | ORAL | 2 refills | Status: DC
Start: 2024-06-15 — End: 2024-07-01

## 2024-06-15 NOTE — Progress Notes (Signed)
 Virtual Visit via Video Note  I connected with Kelsey Rojas on 06/15/24 at  9:00 AM EDT by a video enabled telemedicine application and verified that I am speaking with the correct person using two identifiers.  Location: Patient: Home Provider: Office   I discussed the limitations of evaluation and management by telemedicine and the availability of in person appointments. The patient expressed understanding and agreed to proceed.   I discussed the assessment and treatment plan with the patient. The patient was provided an opportunity to ask questions and all were answered. The patient agreed with the plan and demonstrated an understanding of the instructions.   The patient was advised to call back or seek an in-person evaluation if the symptoms worsen or if the condition fails to improve as anticipated.  I provided  15 minutes of non-face-to-face time during this encounter.   Staci LOISE Kerns, NP    Psychiatric Initial Adult Assessment   Patient Identification: Kelsey Rojas MRN:  [id number] Date of Evaluation:  06/15/2024 Referral Source: Previously completed intensive outpatient programming Chief Complaint:   Chief Complaint  Patient presents with   Depression   Anxiety   Stress   Visit Diagnosis:    ICD-10-CM   1. MDD (major depressive disorder), recurrent episode, moderate (HCC)  F33.1       History of Present Illness:  Kelsey Rojas 28 year old African-American female reports she is self-referred as she previously completed an intensive outpatient programming roughly 4 months prior.  Currently, she states she has been experiencing symptoms related to worsening depression/anxiety, sleep disturbance and grief and loss.  Reports the passing of her grandmother coupled with her recent break-up, stated  I just haven't been myself.  Kelsey Rojas states her ex- boyfriend was cheating on her and tried to normalize the situation.  She denied suicidal or homicidal ideations.   Denied auditory or visual hallucinations.  Denied that she was followed psychiatry services.  States her therapy services recently ended roughly 1 week ago.  Currently seeking individual therapy services at this time.  She denied that she is currently prescribed any psychotropic medications.  Reports she is interested in starting something to help with her mood.  She reports decreased appetite.  She reports her symptoms include poor concentration, decreased motivation, poor appetite and feeling overwhelmed and stressed.  Denied that she is followed by grief and loss therapy.  Reports a family history related to mental illness.  States her father: Bipolar, paternal uncle completed suicide.   Major depressive disorder: generalized anxiety disorder:  Initiate Zoloft  25 mg x 5 days titrate up Zoloft  50 mg daily Continue Elavil  25 mg at bedtime she reports  she is able to rest well with medication.  States she takes it intermittently  [employer] is  sitting ;she is alert/oriented x 4; calm/cooperative; and mood congruent with affect.  Patient is speaking in a clear tone at moderate volume, and normal pace; with good eye contact. Her thought process is coherent and relevant; There is no indication that she is currently responding to internal/external stimuli or experiencing delusional thought content.  Patient denies suicidal/self-harm/homicidal ideation, psychosis, and paranoia.  Patient has remained calm throughout assessment and has answered questions appropriately.   Associated Signs/Symptoms: Depression Symptoms:  depressed mood, difficulty concentrating, anxiety, (Hypo) Manic Symptoms:  Irritable Mood, Anxiety Symptoms:  Excessive Worry, Psychotic Symptoms:  Hallucinations: None PTSD Symptoms: Avoidance:  Decreased Interest/Participation  Past Psychiatric History:   Previous Psychotropic Medications: No   Substance Abuse History in  the last 12 months:  No.  Consequences of  Substance Abuse: NA  Past Medical History:  Past Medical History:  Diagnosis Date   GERD (gastroesophageal reflux disease)    Macromastia 09/2018   Migraines    Morbid obesity (HCC)    OSA on CPAP     Past Surgical History:  Procedure Laterality Date   BREAST REDUCTION SURGERY Bilateral 10/12/2018   Procedure: BILATERAL MAMMARY REDUCTION  (BREAST);  Surgeon: Contogiannis, Ronal Caldron, MD;  Location: [facility] OR;  Service: Plastics;  Laterality: Bilateral;  BILATERAL MAMMARY REDUCTION  (BREAST)   CHOLECYSTECTOMY, LAPAROSCOPIC  04/10/2022   UPPER GASTROINTESTINAL ENDOSCOPY      Family Psychiatric History:   Family History:  Family History  Problem Relation Age of Onset   Depression Mother    Anxiety disorder Mother    Hypertension Mother    Migraines Mother    Cancer Father    Hypertension Father    Migraines Paternal Grandmother    Alcohol abuse Paternal Grandfather     Social History:   Social History   Socioeconomic History   Marital status: Single    Spouse name: Not on file   Number of children: 0   Years of education: Not on file   Highest education level: Bachelor's degree (e.g., BA, AB, BS)  Occupational History   Not on file  Tobacco Use   Smoking status: Never   Smokeless tobacco: Never  Vaping Use   Vaping status: Never Used  Substance and Sexual Activity   Alcohol use: Not Currently    Alcohol/week: 1.0 standard drink of alcohol    Types: 1 Glasses of wine per week    Comment: occasionally   Drug use: No   Sexual activity: Yes    Birth control/protection: None  Other Topics Concern   Not on file  Social History Narrative   Pt lives alone    Pt works    Social Drivers of Corporate investment banker Strain: Medium Risk (03/23/2024)   Received from [facility]   Overall Financial Resource Strain (CARDIA)    Difficulty of Paying Living Expenses: Somewhat hard  Food Insecurity: No Food Insecurity (03/23/2024)   Received from [facility]   Hunger  Vital Sign    Within the past 12 months, you worried that your food would run out before you got the money to buy more.: Never true    Within the past 12 months, the food you bought just didn't last and you didn't have money to get more.: Never true  Transportation Needs: No Transportation Needs (03/23/2024)   Received from [facility] - Transportation    Lack of Transportation (Medical): No    Lack of Transportation (Non-Medical): No  Physical Activity: Insufficiently Active (03/23/2024)   Received from [facility]   Exercise Vital Sign    On average, how many days per week do you engage in moderate to strenuous exercise (like a brisk walk)?: 3 days    On average, how many minutes do you engage in exercise at this level?: 30 min  Stress: No Stress Concern Present (03/23/2024)   Received from [facility] - Occupational Stress Questionnaire    Feeling of Stress : Only a little  Social Connections: Moderately Integrated (03/23/2024)   Received from [facility]   Social Network    How would you rate your social network (family, work, friends)?: Adequate participation with social networks  Additional Social History:   Allergies:   Allergies  Allergen Reactions   Penicillins Rash and Hives    Has patient had a PCN reaction causing immediate rash, facial/tongue/throat swelling, SOB or lightheadedness with hypotension: No Has patient had a PCN reaction causing severe rash involving mucus membranes or skin necrosis: No Has patient had a PCN reaction that required hospitalization: No Has patient had a PCN reaction occurring within the last 10 years: #  #  #  YES  #  #  #  If all of the above answers are NO, then may proceed with Cephalosporin use.  Has patient had a PCN reaction causing immediate rash, facial/tongue/throat swelling, SOB or lightheadedness with hypotension: No Has patient had a PCN reaction causing severe rash  involving mucus membranes or skin necrosis: No Has patient had a PCN reaction that required hospitalization: No Has patient had a PCN reaction occurring within the last 10 years: #  #  #  YES  #  #  #  If all of the above answers are NO, then may proceed with Cephalosporin use.   Ciprofloxacin Hives    Family history of a reaction UNSPECIFIED FAMILY REACTION Family history of a reaction UNSPECIFIED FAMILY REACTION   Amoxicillin  Rash   Qulipta [Atogepant] Hives and Rash    Metabolic Disorder Labs: No results found for: HGBA1C, MPG No results found for: PROLACTIN No results found for: CHOL, TRIG, HDL, CHOLHDL, VLDL, LDLCALC No results found for: TSH  Therapeutic Level Labs: No results found for: LITHIUM No results found for: CBMZ No results found for: VALPROATE  Current Medications: Current Outpatient Medications  Medication Sig Dispense Refill   amitriptyline  (ELAVIL ) 25 MG tablet Take 1 tablet (25 mg total) by mouth at bedtime. 30 tablet 3   ondansetron  (ZOFRAN -ODT) 4 MG disintegrating tablet Take 1 tablet (4 mg total) by mouth every 8 (eight) hours as needed. 20 tablet 0   rizatriptan  (MAXALT -MLT) 10 MG disintegrating tablet Take 1 tablet (10 mg total) by mouth as needed for migraine. May repeat in 2 hours if needed 9 tablet 11   sertraline  (ZOLOFT ) 25 MG tablet Take 1 tablet (25 mg total) by mouth daily for 5 days, THEN 2 tablets (50 mg total) daily. 30 tablet 2   No current facility-administered medications for this visit.    Musculoskeletal:  Virtual assessment  Psychiatric Specialty Exam: Review of Systems  There were no vitals taken for this visit.There is no height or weight on file to calculate BMI.  General Appearance: Casual  Eye Contact:  Good  Speech:  Clear and Coherent  Volume:  Normal  Mood:  Anxious and Depressed  Affect:  Congruent  Thought Process:  Coherent  Orientation:  Full (Time, Place, and Person)  Thought Content:   Logical  Suicidal Thoughts:  No  Homicidal Thoughts:  No  Memory:  Immediate;   Good Recent;   Good  Judgement:  Good  Insight:  Good  Psychomotor Activity:  Normal  Concentration:  Concentration: Good  Recall:  Good  Fund of Knowledge:Good  Language: Good  Akathisia:  No  Handed:  Right  AIMS (if indicated):  not done  Assets:  Communication Skills Desire for Improvement  ADL's:  Intact  Cognition: WNL  Sleep:  Fair   Screenings: GAD-7    Advertising copywriter from 06/14/2024 in BEHAVIORAL HEALTH INTENSIVE PSYCH  Total GAD-7 Score 14   PHQ2-9    Flowsheet Row Counselor from 06/14/2024 in BEHAVIORAL HEALTH INTENSIVE  PSYCH Counselor from 01/28/2022 in BEHAVIORAL HEALTH INTENSIVE PSYCH  PHQ-2 Total Score 4 6  PHQ-9 Total Score 17 19   Flowsheet Row ED from 12/23/2023 in [facility] Emergency Department at [facility] ED from 06/19/2023 in [facility] Emergency Department at [facility] Counselor from 01/28/2022 in BEHAVIORAL HEALTH INTENSIVE PSYCH  C-SSRS RISK CATEGORY No Risk No Risk Error: Question 6 not populated    Assessment and Plan:  Start Intensive Outpatient Programming  Start Zolfot 25mg -50mg  daily   Patient enrolled in Intensive Outpatient  Hospitalization Program, patient's current medications are to be continued, the following medications are being prescribed Zoloft  25 mg-50 mg a comprehensive treatment plan will be developed and side effects of medications have been reviewed with patient.   Treatment options and alternatives reviewed with patient and patient understands the above plan. Treatment plan was reviewed and agreed upon by NP T.Ezzard and patient Kelsey Rojas  need for group services.  Collaboration of Care: Medication Management AEB start Zoloft  50 mg daily  Patient/Guardian was advised Release of Information must be obtained prior to any record release in order to collaborate their care with an outside provider. Patient/Guardian was  advised if they have not already done so to contact the registration department to sign all necessary forms in order for us  to release information regarding their care.   Consent: Patient/Guardian gives verbal consent for treatment and assignment of benefits for services provided during this visit. Patient/Guardian expressed understanding and agreed to proceed.   Staci LOISE Ezzard, NP 7/30/20252:10 PM

## 2024-06-15 NOTE — Progress Notes (Signed)
 Virtual Visit via Video Note   I connected with Kelsey Rojas, who prefers go by Kelsey Rojas" on 06/15/24 at  9:00 AM EDT by a video enabled telemedicine application and verified that I am speaking with the correct person using two identifiers.   At orientation to the IOP program, Case Manager discussed the limitations of evaluation and management by telemedicine and the availability of in person appointments. The patient expressed understanding and agreed to proceed with virtual visits throughout the duration of the program.   Location:  Patient: Patient Home Provider: [facility]   History of Present Illness: MDD    Observations/Objective: Check In: Case Manager checked in with all participants to review discharge dates, insurance authorizations, work-related documents and needs from the treatment team regarding medications. Kelsey Rojas stated needs and engaged in discussion.    Initial Therapeutic Activity: Counselor facilitated a check-in with Kelsey Rojas to assess for safety, sobriety and medication compliance.  Counselor also inquired about Kelsey Rojas's current emotional ratings, as well as any significant changes in thoughts, feelings or behavior since previous check in.  Kelsey Rojas presented for session on time and was alert, oriented x5, with no evidence or self-report of active SI/HI or A/V H.  Kelsey Rojas reported compliance with medication and denied use of alcohol or illicit substances.  Kelsey Rojas reported scores of 2/10 for depression, 9/10 for anxiety, and 7/10 for irritability.  Kelsey Rojas denied any recent panic attacks.  Kelsey Rojas reported that a struggle has been experiencing mood episodes lately, which has led her to believe that she might be 'bipolar'.  She reported that she went on a recent trip and had an outburst toward her partner, who was not respecting her boundaries.  Kelsey Rojas reported that a success was deciding to return to group therapy to relearn coping skills that could improve her mood.  Kelsey Rojas reported that her goal today is to take a  walk at the track and consider going out to brunch with a friend.          Second Therapeutic Activity: Counselor introduced Kelsey Rojas, [facility] Pharmacist, to provide psychoeducation on topic of medication compliance with members today.  Kelsey provided psychoeducation on classes of medications such as antidepressants, antipsychotics, what symptoms they are intended to treat, and any side effects one might encounter while on a particular prescription.  Time was allowed for clients to ask any questions they might have of Kelsey Rojas regarding this specialty.  Intervention was effective, as evidenced by Kelsey Rojas participating in discussion with speaker on the subject, reporting that she has been prescribed Kelsey Rojas  for migraines and wondered about additional benefits this might provide her.  Kelsey Rojas was receptive to feedback from pharmacist on additional benefits medication could provide, such as improved sleep.    Third Therapeutic Activity: Counselor offered to teach group members an ACT relaxation technique today to aid in managing difficult thoughts, feelings, urges, and sensations.  Counselor guided members through process of getting comfortable, achieving relaxing breathing rhythm, and then maintaining this throughout activity.  Counselor invited members to imagine a gently flowing stream in their mind with leaves floating upon it, and when any thoughts, feelings, urges, or sensations arose, good or bad, they were instructed to visualize placing them on these passing leaves over course of 15 minutes practice.  Intervention was effective, as evidenced by Kelsey Rojas successfully participating in activity and reporting that at first she became anxious by overthinking to visual aspect of the meditation, but eventually formed a concrete image in her mind, was able to let  go of ruminations and relax.    Assessment and Plan: Counselor recommends that Kelsey Rojas remain in IOP treatment to better manage mental health symptoms, ensure  stability and pursue completion of treatment plan goals. Counselor recommends adherence to crisis/safety plan, taking medications as prescribed, and following up with medical professionals if any issues arise.    Follow Up Instructions: Counselor will send [employer] Teams link for session tomorrow.  Kelsey Rojas was advised to call back or seek an in-person evaluation if the symptoms worsen or if the condition fails to improve as anticipated.   Collaboration of Care:   Medication Management AEB Kelsey Kerns, NP                                           Case Manager AEB Kelsey Gaskins, [name]    Patient/Guardian was advised Release of Information must be obtained prior to any record release in order to collaborate their care with an outside provider. Patient/Guardian was advised if they have not already done so to contact the registration department to sign all necessary forms in order for us  to release information regarding their care.    Consent: Patient/Guardian gives verbal consent for treatment and assignment of benefits for services provided during this visit. Patient/Guardian expressed understanding and agreed to proceed.   I provided 180 minutes of non-face-to-face time during this encounter.   Kelsey Ricker, LCSW, LCAS 06/15/24

## 2024-06-16 ENCOUNTER — Other Ambulatory Visit (HOSPITAL_COMMUNITY): Admitting: Licensed Clinical Social Worker

## 2024-06-16 DIAGNOSIS — F331 Major depressive disorder, recurrent, moderate: Secondary | ICD-10-CM

## 2024-06-16 NOTE — Progress Notes (Signed)
 Virtual Visit via Video Note   I connected with Nancee Sorrel, who prefers go by [name]" on 06/16/24 at  9:00 AM EDT by a video enabled telemedicine application and verified that I am speaking with the correct person using two identifiers.   At orientation to the IOP program, Case Manager discussed the limitations of evaluation and management by telemedicine and the availability of in person appointments. The patient expressed understanding and agreed to proceed with virtual visits throughout the duration of the program.   Location:  Patient: Patient Home Provider: [facility]   History of Present Illness: MDD    Observations/Objective: Check In: Case Manager checked in with all participants to review discharge dates, insurance authorizations, work-related documents and needs from the treatment team regarding medications. Kelsey Rojas stated needs and engaged in discussion.    Initial Therapeutic Activity: Counselor facilitated a check-in with Kelsey Rojas to assess for safety, sobriety and medication compliance.  Counselor also inquired about [name]'s current emotional ratings, as well as any significant changes in thoughts, feelings or behavior since previous check in.  Kelsey Rojas presented for session on time and was alert, oriented x5, with no evidence or self-report of active SI/HI or A/V H.  Kelsey Rojas reported compliance with medication and denied use of alcohol or illicit substances.  Kelsey Rojas reported scores of 3/10 for depression, 6/10 for anxiety, and 6/10 for irritability.  Kelsey Rojas denied any recent outbursts or panic attacks.  Kelsey Rojas reported that a struggle was talking to her ex, and finding it difficult to talk about why she set a boundary recently.  Kelsey Rojas reported that a success was moving some things around her home for a change of scenery yesterday.  Kelsey Rojas reported that her goal today is to pick up her medicine and set a boundary with her ex.         Second Therapeutic Activity: Counselor introduced topic of assertive communication  today.  Counselor shared various handouts with members virtually in group to read along with on the subject.  These handouts defined assertive communication as a communication style in which a person stands up for their own needs and wants, while also taking into consideration the needs and wants of others, without behaving in a passive or aggressive way.  Traits of assertive communicators were highlighted such as using appropriate speaking volume, maintaining eye contact, using confident language, and avoiding interruption.  Members were also provided with tips on how to improve communication, including respecting oneself, expressing thoughts and feelings calmly, and saying "No" when necessary.  Members were given a variety of scenarios where they could practice using these tips to respond in an assertive manner.  Intervention was effective, as evidenced by Kelsey Rojas participating in discussion on topic, reporting that she has a passive communication style due to traits such as prioritizing others needs, not expressing her thoughts and feelings, and lacking confidence.  Kelsey Rojas reported that this has led to issues such as struggling to set boundaries with people taking advantage of her kindness.  Kelsey Rojas showed more effective use of assertive communication skills through engagement in roleplay activities.    Assessment and Plan: Counselor recommends that Kelsey Rojas remain in IOP treatment to better manage mental health symptoms, ensure stability and pursue completion of treatment plan goals. Counselor recommends adherence to crisis/safety plan, taking medications as prescribed, and following up with medical professionals if any issues arise.    Follow Up Instructions: Counselor will send [employer] Teams link for session tomorrow.  Kelsey Rojas was advised to call back or  seek an in-person evaluation if the symptoms worsen or if the condition fails to improve as anticipated.   Collaboration of Care:   Medication Management AEB Staci Kerns, NP                                           Case Manager AEB Ricka Gaskins, [name]    Patient/Guardian was advised Release of Information must be obtained prior to any record release in order to collaborate their care with an outside provider. Patient/Guardian was advised if they have not already done so to contact the registration department to sign all necessary forms in order for us  to release information regarding their care.    Consent: Patient/Guardian gives verbal consent for treatment and assignment of benefits for services provided during this visit. Patient/Guardian expressed understanding and agreed to proceed.   I provided 180 minutes of non-face-to-face time during this encounter.   Darleene Ricker, LCSW, LCAS 06/16/24

## 2024-06-17 ENCOUNTER — Other Ambulatory Visit (HOSPITAL_COMMUNITY): Attending: Psychiatry | Admitting: Psychiatry

## 2024-06-17 DIAGNOSIS — F331 Major depressive disorder, recurrent, moderate: Secondary | ICD-10-CM | POA: Insufficient documentation

## 2024-06-17 NOTE — Progress Notes (Signed)
 Virtual Visit via Video Note   I connected with Kelsey Rojas, who prefers go by Kelsey Rojas" on 06/17/24 at  9:00 AM EDT by a video enabled telemedicine application and verified that I am speaking with the correct person using two identifiers.   At orientation to the IOP program, Case Manager discussed the limitations of evaluation and management by telemedicine and the availability of in person appointments. The patient expressed understanding and agreed to proceed with virtual visits throughout the duration of the program.   Location:  Patient: Patient Home Provider: Home Office   History of Present Illness: MDD    Observations/Objective: Check In: Case Manager checked in with all participants to review discharge dates, insurance authorizations, work-related documents and needs from the treatment team regarding medications. Kelsey Rojas stated needs and engaged in discussion.    Initial Therapeutic Activity: Counselor facilitated a check-in with Kelsey Rojas to assess for safety, sobriety and medication compliance.  Counselor also inquired about Kelsey Rojas's current emotional ratings, as well as any significant changes in thoughts, feelings or behavior since previous check in.  Kelsey Rojas presented for session on time and was alert, oriented x5, with no evidence or self-report of active SI/HI or A/V H.  Kelsey Rojas reported compliance with medication and denied use of alcohol or illicit substances.  Kelsey Rojas reported scores of 4/10 for depression, 10/10 for anxiety, and 7/10 for irritability.  Kelsey Rojas denied any recent outbursts or panic attacks.  Kelsey Rojas reported that a struggle was learning that a friend is experiencing significant illness and may pass soon, which has led her to ruminate on a past loss.  Kelsey Rojas reported that a success was eating a nice meal after group.  Kelsey Rojas reported that her goal today is to go to a family cookout and assist her parents over the weekend.          Second Therapeutic Activity: Counselor discussed topic of sleep hygiene today  with group.  Counselor defined this as the habits, behaviors and environmental factors that can be adjusted to improve overall sleep quality.  Counselor also discussed how lack of consistent sleep can negatively affect mood and overall mental health.  Counselor provided members with a screening to complete assessing typical barriers one might face in achieving quality sleep at night (i.e. struggling to get up in the morning, waking up throughout the night, tossing/turning, etc), and inquired about members' perception of sleep hygiene at present.  Counselor offered techniques to members via a virtual handout which could be implemented in order to improve sleep hygiene, such as sticking to a normal morning/night routine, avoiding using of electronics too close to bedtime, avoiding heavy meals before bed, using a sleep journal to track changes and address anxious thoughts, as well as avoiding naps during the daytime, ensuring proper use of medications if prescribed any by provider(s), and more.  Counselor inquired about changes members intend to make to sleep hygiene based upon information covered today.  Interventions were effective, as evidenced by Kelsey Rojas actively participating in discussion on subject, reporting that she has noticed significant decrease in sleep quality over the past year, and associated this to loss of her grandmother, sleep apnea, and overall stressors in life increasing.  Kelsey Rojas stated "I go to bed, and try to sleep, but then my mind starts racing".  Kelsey Rojas also completed a sleep assessment, which revealed that she is moderately sleep deprived at present.  Kelsey Rojas reported that she plans to improve sleep hygiene over the course of treatment by sticking to a normal routine,  keeping the temperature of her room cool, setting up black out curtains to reduce light getting into her room, and buying a sound machine to help cut down on distracting sounds in the middle of the night.    Third Therapeutic Activity:  Psycho-educational portion of group was provided by Lorane Rochester, Interior and spatial designer of community education with [employer].  [name] provided information on history of her local agency, mission statement, and the variety of unique services offered which group members might find beneficial to engage in, including both virtual and in-person support groups, as well as peer support program for mentoring.  [name] offered time to answer member's questions regarding services and encouraged them to consider utilizing these services to assist in working towards their individual wellness goals.  Intervention effectiveness could not be measured, as client did not participate.    Assessment and Plan: Counselor recommends that Kelsey Rojas remain in IOP treatment to better manage mental health symptoms, ensure stability and pursue completion of treatment plan goals. Counselor recommends adherence to crisis/safety plan, taking medications as prescribed, and following up with medical professionals if any issues arise.    Follow Up Instructions: Counselor will send [employer] Teams link for session tomorrow.  Kelsey Rojas was advised to call back or seek an in-person evaluation if the symptoms worsen or if the condition fails to improve as anticipated.   Collaboration of Care:   Medication Management AEB Staci Kerns, NP                                           Case Manager AEB Ricka Gaskins, [name]    Patient/Guardian was advised Release of Information must be obtained prior to any record release in order to collaborate their care with an outside provider. Patient/Guardian was advised if they have not already done so to contact the registration department to sign all necessary forms in order for us  to release information regarding their care.    Consent: Patient/Guardian gives verbal consent for treatment and assignment of benefits for services provided during this visit. Patient/Guardian expressed understanding and agreed to  proceed.   I provided 180 minutes of non-face-to-face time during this encounter.   Darleene Ricker, LCSW, LCAS 06/17/24

## 2024-06-19 ENCOUNTER — Other Ambulatory Visit: Payer: Self-pay | Admitting: Diagnostic Neuroimaging

## 2024-06-20 ENCOUNTER — Other Ambulatory Visit (HOSPITAL_COMMUNITY)

## 2024-06-20 ENCOUNTER — Telehealth (HOSPITAL_COMMUNITY): Payer: Self-pay | Admitting: Psychiatry

## 2024-06-20 NOTE — Telephone Encounter (Signed)
 D:  Pt informed the MH-IOP team that she wouldn't be attending group this morning d/t a loss in her family.  According to pt, she started the new medication and is tolerating it well.  A:  Provided pt with support.  Informed Staci Kerns, NP.  R:  Pt receptive.

## 2024-06-21 ENCOUNTER — Ambulatory Visit (HOSPITAL_COMMUNITY)

## 2024-06-21 ENCOUNTER — Encounter (HOSPITAL_COMMUNITY): Payer: Self-pay

## 2024-06-21 ENCOUNTER — Other Ambulatory Visit (HOSPITAL_COMMUNITY)

## 2024-06-21 ENCOUNTER — Telehealth (HOSPITAL_COMMUNITY): Payer: Self-pay | Admitting: Psychiatry

## 2024-06-21 NOTE — Telephone Encounter (Signed)
 D:  Pt sent an email requesting to be excused again today d/t death in her family.  A:  Pt is excused from virtual MH-IOP today.  Inform treatment team.  R:  Pt receptive.

## 2024-06-22 ENCOUNTER — Other Ambulatory Visit (HOSPITAL_COMMUNITY): Attending: Psychiatry | Admitting: Licensed Clinical Social Worker

## 2024-06-22 ENCOUNTER — Ambulatory Visit (HOSPITAL_COMMUNITY)

## 2024-06-22 DIAGNOSIS — F331 Major depressive disorder, recurrent, moderate: Secondary | ICD-10-CM | POA: Diagnosis present
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
## 2024-06-23 ENCOUNTER — Other Ambulatory Visit (HOSPITAL_COMMUNITY)

## 2024-06-23 ENCOUNTER — Ambulatory Visit (HOSPITAL_COMMUNITY)

## 2024-06-23 ENCOUNTER — Other Ambulatory Visit (HOSPITAL_COMMUNITY): Payer: Self-pay | Admitting: Family

## 2024-06-23 ENCOUNTER — Telehealth (HOSPITAL_COMMUNITY): Payer: Self-pay | Admitting: Psychiatry

## 2024-06-23 NOTE — Telephone Encounter (Signed)
 D: Pt called and stated that she wouldn't be attending virtual MH-IOP today d/t oversleeping.  A:  Excused pt today.  Inform treatment team.  R:  Pt receptive.

## 2024-06-24 ENCOUNTER — Other Ambulatory Visit (HOSPITAL_COMMUNITY): Attending: Psychiatry | Admitting: Psychiatry

## 2024-06-24 ENCOUNTER — Other Ambulatory Visit (HOSPITAL_COMMUNITY)

## 2024-06-24 DIAGNOSIS — F331 Major depressive disorder, recurrent, moderate: Secondary | ICD-10-CM | POA: Diagnosis present

## 2024-06-24 DIAGNOSIS — F4310 Post-traumatic stress disorder, unspecified: Secondary | ICD-10-CM | POA: Insufficient documentation

## 2024-06-24 NOTE — Progress Notes (Signed)
 Virtual Visit via Video Note   I connected with Kelsey Rojas, who prefers go by Kelsey Rojas" on 06/24/24 at  9:00 AM EDT by a video enabled telemedicine application and verified that I am speaking with the correct person using two identifiers.   At orientation to the IOP program, Case Manager discussed the limitations of evaluation and management by telemedicine and the availability of in person appointments. The patient expressed understanding and agreed to proceed with virtual visits throughout the duration of the program.   Location:  Patient: Patient Home Provider: Home Office   History of Present Illness: MDD   Observations/Objective: Check In: Case Manager checked in with all participants to review discharge dates, insurance authorizations, work-related documents and needs from the treatment team regarding medications. Kelsey Rojas stated needs and engaged in discussion.    Initial Therapeutic Activity: Counselor facilitated a check-in with Kelsey Rojas to assess for safety, sobriety and medication compliance.  Counselor also inquired about Kelsey Rojas's current emotional ratings, as well as any significant changes in thoughts, feelings or behavior since previous check in.  Kelsey Rojas presented for session on time and was alert, oriented x5, with no evidence or self-report of active SI/HI or A/V H.  Kelsey Rojas reported compliance with medication and denied use of alcohol or illicit substances.  Kelsey Rojas reported scores of 4/10 for depression, 7/10 for anxiety, and 9/10 for irritability.  Kelsey Rojas denied any recent outbursts or panic attacks.  Kelsey Rojas reported that a struggle was oversleeping yesterday and missing group.  Kelsey Rojas reported that a success was cleaning her carpet and doing dishes yesterday to stay busy.  Kelsey Rojas reported that her goal tomorrow is to drive home and attend a funeral.          Second Therapeutic Activity: Counselor covered topic of core beliefs with group today.  Counselor virtually shared a handout on the subject, which explained how  everyone looks at the world differently, and two people can have the same experience, but have different interpretations of what happened.  Members were encouraged to think of these like sunglasses with different "shades" influencing perception towards positive or negative outcomes.  Examples of negative core beliefs were provided, such as "I'm unlovable", "I'm not good enough", and "I'm a bad person".  Members were asked to share which one(s) they could relate to, and then identify evidence which contradicts these beliefs.  Counselor also provided psychoeducation on positive affirmations today.  Counselor explained how these are positive statements which can be spoken out loud or recited mentally to challenge negative thoughts and/or core beliefs to improve mood and outlook each day.  Counselor shared a comprehensive list of affirmations virtually to members with different categories, including ones for health, confidence, success, and happiness.  Counselor invited members to look through this list and identify any which resonated with them, and practice saying them out loud with sincerity.  Intervention was effective, as evidenced by Kelsey Rojas successfully participating in discussion on the subject and reporting that she could relate to several negative core beliefs listed on the handout, such as "I am unworthy", "I am trapped", and "People can't be trusted".  Kelsey Rojas was able to successfully challenge the core belief "I am trapped" by listing evidence that contradicted it, reporting that she has been able to do more work around the house, get out of bed easier, and open up about her feelings in this group and gain support.  Kelsey Rojas also reported that she liked several of the positive affirmations listed, such as "I don't wanna look  like anybody but myself", "I press on because I believe in my path", and "All my problems have solutions".      Assessment and Plan: Counselor recommends that Kelsey Rojas remain in IOP treatment to  better manage mental health symptoms, ensure stability and pursue completion of treatment plan goals. Counselor recommends adherence to crisis/safety plan, taking medications as prescribed, and following up with medical professionals if any issues arise.    Follow Up Instructions: Counselor will send [employer] Teams link for session tomorrow.  Kelsey Rojas was advised to call back or seek an in-person evaluation if the symptoms worsen or if the condition fails to improve as anticipated.   Collaboration of Care:   Medication Management AEB Staci Kerns, NP                                           Case Manager AEB Ricka Gaskins, [name]    Patient/Guardian was advised Release of Information must be obtained prior to any record release in order to collaborate their care with an outside provider. Patient/Guardian was advised if they have not already done so to contact the registration department to sign all necessary forms in order for us  to release information regarding their care.    Consent: Patient/Guardian gives verbal consent for treatment and assignment of benefits for services provided during this visit. Patient/Guardian expressed understanding and agreed to proceed.   I provided 180 minutes of non-face-to-face time during this encounter.   Darleene Ricker, LCSW, LCAS 06/24/24

## 2024-06-27 ENCOUNTER — Other Ambulatory Visit (HOSPITAL_COMMUNITY): Attending: Psychiatry | Admitting: Psychiatry

## 2024-06-27 ENCOUNTER — Ambulatory Visit (HOSPITAL_COMMUNITY)

## 2024-06-27 DIAGNOSIS — F331 Major depressive disorder, recurrent, moderate: Secondary | ICD-10-CM | POA: Diagnosis present

## 2024-06-27 NOTE — Progress Notes (Signed)
 Virtual Visit via Video Note   I connected with Nancee Sorrel, who prefers go by [name]" on 06/27/24 at  9:00 AM EDT by a video enabled telemedicine application and verified that I am speaking with the correct person using two identifiers.   At orientation to the IOP program, Case Manager discussed the limitations of evaluation and management by telemedicine and the availability of in person appointments. The patient expressed understanding and agreed to proceed with virtual visits throughout the duration of the program.   Location:  Patient: Patient Home Provider: Home Office   History of Present Illness: MDD    Observations/Objective: Check In: Case Manager checked in with all participants to review discharge dates, insurance authorizations, work-related documents and needs from the treatment team regarding medications. [name] stated needs and engaged in discussion.    Initial Therapeutic Activity: Counselor facilitated a check-in with [name] to assess for safety, sobriety and medication compliance.  Counselor also inquired about [name]'s current emotional ratings, as well as any significant changes in thoughts, feelings or behavior since previous check in.  [name] presented for session on time and was alert, oriented x5, with no evidence or self-report of active SI/HI or A/V H.  [name] reported compliance with medication and denied use of alcohol or illicit substances.  [name] reported scores of 7/10 for depression, 8/10 for anxiety, and 8/10 for anger/irritability.  [name] denied any recent outbursts or panic attacks.  [name] reported that a struggle was attending funeral services with family over the weekend.  [name] reported that a success was spending time with friends for distraction later on.  [name] reported that her goal today is to work on a to-do list to stay productive.       Second Therapeutic Activity: Counselor introduced Amanda Davee Lomax, [facility] Chaplain to provide psychoeducation on topic of Grief and Loss with  members today.  Alan began discussion by checking in with the group about their baseline mood today, general thoughts on what grief means to them and how it has affected them personally in the past.  Alan provided information on how the process of grief/loss can differ depending upon one's unique culture, and categories of loss one could experience (i.e. loss of a person, animal, relationship, job, identity, etc).  Alan encouraged members to be mindful of how pervasive loss can be, and how to recognize signs which could indicate that this is having an impact on one's overall mental health and wellbeing.  Intervention was effective, as evidenced by [name] participating in discussion with speaker on the subject, reporting that she has experienced several losses recently in short succession, and this has been hard to deal with since she never learned how to grieve in healthier ways.  She was receptive to empathetic feedback from chaplain, and reported that something which brings her joy and could help her cope with loss is working with yarn to make things like scarves since they bring her comfort.    Third Therapeutic Activity: Counselor discussed topic of distress tolerance today with group.  Counselor shared virtual handout with members that offered a DBT approach represented by the acronym ACCEPTS, and outlined strategies for distracting oneself from distressing emotions, allowing appropriate time for these emotions to lesson in intensity and eventually fade away.  Strategies offered included engaging in positive activities, contributing to the wellbeing of others, comparing one's present situation to a previously difficult one to highlight resilience, using mental imagery, and physical grounding.  Counselor assisted members in creating their own realistic  ACCEPTS plan for tackling a distressing emotion of their choice.  Intervention was effective, as evidenced by [name] participating in creation of the plan,  choosing disgusted as her emotion of focus, and identifying several helpful approaches for distraction, such as reading an interesting book, writing in her journal, making a scarf for a loved one, reflecting upon past challenges she has overcome to highlight her resilience, practicing deep breathing, imagining herself on vacation at a sunset beach, doing various 'brain games' that can shift her thoughts to something neutral, or chewing on ice cubes.    Assessment and Plan: Counselor recommends that [name] remain in IOP treatment to better manage mental health symptoms, ensure stability and pursue completion of treatment plan goals. Counselor recommends adherence to crisis/safety plan, taking medications as prescribed, and following up with medical professionals if any issues arise.    Follow Up Instructions: Counselor will send [employer] Teams link for session tomorrow.  [name] was advised to call back or seek an in-person evaluation if the symptoms worsen or if the condition fails to improve as anticipated.   Collaboration of Care:   Medication Management AEB Staci Kerns, NP                                           Case Manager AEB Ricka Gaskins, [name]    Patient/Guardian was advised Release of Information must be obtained prior to any record release in order to collaborate their care with an outside provider. Patient/Guardian was advised if they have not already done so to contact the registration department to sign all necessary forms in order for us  to release information regarding their care.    Consent: Patient/Guardian gives verbal consent for treatment and assignment of benefits for services provided during this visit. Patient/Guardian expressed understanding and agreed to proceed.   I provided 180 minutes of non-face-to-face time during this encounter.   Darleene Ricker, LCSW, LCAS 06/27/24

## 2024-06-28 ENCOUNTER — Other Ambulatory Visit (HOSPITAL_COMMUNITY): Attending: Psychiatry | Admitting: Psychiatry

## 2024-06-28 ENCOUNTER — Other Ambulatory Visit (HOSPITAL_COMMUNITY)

## 2024-06-28 DIAGNOSIS — F4310 Post-traumatic stress disorder, unspecified: Secondary | ICD-10-CM | POA: Diagnosis not present

## 2024-06-28 DIAGNOSIS — F331 Major depressive disorder, recurrent, moderate: Secondary | ICD-10-CM | POA: Insufficient documentation

## 2024-06-28 NOTE — Progress Notes (Signed)
 Virtual Visit via Video Note   I connected with Kelsey Rojas, who prefers go by Kelsey Rojas" on 06/28/24 at  9:00 AM EDT by a video enabled telemedicine application and verified that I am speaking with the correct person using two identifiers.   At orientation to the IOP program, Case Manager discussed the limitations of evaluation and management by telemedicine and the availability of in person appointments. The patient expressed understanding and agreed to proceed with virtual visits throughout the duration of the program.   Location:  Patient: Patient Home Provider: [facility]   History of Present Illness: MDD    Observations/Objective: Check In: Case Manager checked in with all participants to review discharge dates, insurance authorizations, work-related documents and needs from the treatment team regarding medications. Kelsey Rojas stated needs and engaged in discussion.    Initial Therapeutic Activity: Counselor facilitated a check-in with Kelsey Rojas to assess for safety, sobriety and medication compliance.  Counselor also inquired about Kelsey Rojas's current emotional ratings, as well as any significant changes in thoughts, feelings or behavior since previous check in.  Kelsey Rojas presented for session on time and was alert, oriented x5, with no evidence or self-report of active SI/HI or A/V H.  Kelsey Rojas reported compliance with medication and denied use of alcohol or illicit substances.  Kelsey Rojas reported scores of 3/10 for depression, 6/10 for anxiety, and 0/10 for anger/irritability.  Kelsey Rojas denied any recent outbursts or panic attacks.  Kelsey Rojas reported that a struggle was experiencing cold symptoms this morning, such as coughing, and congestion.  Kelsey Rojas reported that a success was taking a bubble bath yesterday for self-care, and cutting back on her to-do list to avoid feeling overwhelmed.  Kelsey Rojas reported that her goal today is to do some housecleaning to stay busy.          Second Therapeutic Activity: Counselor engaged the group in  discussion on managing work/life balance today to improve mental health and wellness.  Counselor explained how finding balance between responsibilities at home and work place can be challenging, and lead to increased stress.  Counselor facilitated discussion on what challenges members are currently, or have historically faced.  Counselor also discussed strategies for improving work/life balance while members work on their mental health during treatment.  Some of these included keeping track of time management; creating a list of priorities and scaling importance; setting realistic, measurable goals each day; establishing boundaries; taking care of health needs; and nurturing relationships at home and work for support.  Counselor inquired about areas where members feel they are excelling, as well as areas they could focus on during treatment. Intervention was effective, as evidenced by Kelsey Rojas actively participating in discussion on topic and reporting that she used to enjoy her job, but after taking a promotion, things became too stressful, and she asked to go back to her previous position, which was met with resistance from management.  Kelsey Rojas stated "They don't care or understand the emotional damage they are causing.  I'm just a number".  Kelsey Rojas reported that she experienced several symptoms of burnout, including self-doubt, lack of motivation, detachment, feeling tired and drained, and migraines.  Kelsey Rojas reported that there have also been numerous warning signs such as working through lunch, making herself unavailable to friends and family, and getting out of shape.  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    Assessment and Plan: Counselor  recommends that Kelsey Rojas remain in IOP treatment to better manage mental health symptoms, ensure stability and pursue completion of treatment plan goals. Counselor recommends adherence to crisis/safety plan, taking medications as prescribed, and following up with medical professionals if any issues arise.    Follow Up Instructions: Counselor will send [employer] Teams link for session tomorrow.  Kelsey Rojas was advised to call back or seek an in-person evaluation if the symptoms worsen or if the condition fails to improve as anticipated.   Collaboration of Care:   Medication Management AEB Staci Kerns, NP                                           Case Manager AEB Ricka Gaskins, [name]    Patient/Guardian was advised Release of Information must be obtained prior to any record release in order to collaborate their care with an outside provider. Patient/Guardian was advised if they have not already done so to contact the registration department to sign all necessary forms in order for us  to release information regarding their care.    Consent: Patient/Guardian gives verbal consent for treatment and assignment of benefits for services provided during this visit. Patient/Guardian expressed understanding and agreed to proceed.   I provided 180 minutes of non-face-to-face time during this encounter.   Darleene Ricker, LCSW, LCAS 06/28/24

## 2024-06-29 ENCOUNTER — Other Ambulatory Visit (HOSPITAL_COMMUNITY): Attending: Psychiatry | Admitting: Psychiatry

## 2024-06-29 ENCOUNTER — Other Ambulatory Visit (HOSPITAL_COMMUNITY)

## 2024-06-29 DIAGNOSIS — F331 Major depressive disorder, recurrent, moderate: Secondary | ICD-10-CM | POA: Diagnosis present

## 2024-06-29 NOTE — Progress Notes (Signed)
 Virtual Visit via Video Note   I connected with Kelsey Rojas, who prefers go by Kelsey Rojas" on 06/29/24 at  9:00 AM EDT by a video enabled telemedicine application and verified that I am speaking with the correct person using two identifiers.   At orientation to the IOP program, Case Manager discussed the limitations of evaluation and management by telemedicine and the availability of in person appointments. The patient expressed understanding and agreed to proceed with virtual visits throughout the duration of the program.   Location:  Patient: Patient Home Provider: Home Office   History of Present Illness: MDD    Observations/Objective: Check In: Case Manager checked in with all participants to review discharge dates, insurance authorizations, work-related documents and needs from the treatment team regarding medications. Kelsey Rojas stated needs and engaged in discussion.    Initial Therapeutic Activity: Counselor facilitated a check-in with Kelsey Rojas to assess for safety, sobriety and medication compliance.  Counselor also inquired about Kelsey Rojas's current emotional ratings, as well as any significant changes in thoughts, feelings or behavior since previous check in.  Kelsey Rojas presented for session on time and was alert, oriented x5, with no evidence or self-report of active SI/HI or A/V H.  Kelsey Rojas reported compliance with medication and denied use of alcohol or illicit substances.  Kelsey Rojas reported scores of 4/10 for depression, 8/10 for anxiety, and 8/10 for anger/irritability.  Kelsey Rojas denied any recent outbursts or panic attacks.  Kelsey Rojas reported that a struggle was feeling worse this morning, as her cold has progressed.  She reported that she also received a call from her job this morning informing her that her job duties will be changing abruptly, which was very frustrating.  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  Kelsey Rojas reported that her goal today is to take time to rest and  make a hearty soup to help recover.          Second Therapeutic Activity: Counselor introduced Kelsey Rojas, Kelsey Rojas, to provide psychoeducation on topic of medication compliance with members today.  Kelsey provided psychoeducation on classes of medications such as antidepressants, antipsychotics, what symptoms they are intended to treat, and any side effects one might encounter while on a particular prescription.  Time was allowed for clients to ask any questions they might have of [facility] regarding this specialty.  Intervention was effective, as evidenced by Kelsey Rojas participating in discussion with speaker on the subject, reporting that she has been on Zoloft , but was concerned about whether this was appropriate for current symptoms she is dealing with.  Kelsey Rojas was receptive to clarity from Rojas on appropriate medications to treat her symptoms, and whether a mood stabilizer may be more effective versus an antidepressant depending upon efficacy.      Third Therapeutic Activity: Counselor covered topic of attachment styles today.  Counselor virtually shared a handout with the group on this topic which defined attachment styles as how people think about and behave in relationships.  Styles were broken down by category, including secure attachment where one believes close relationships are trustworthy, compared to insecure attachment (i.e. anxious, avoidant, or anxious-avoidant) where one is distrusting or worries about their bond with others.  Counselor inquired about which attachment style members most related to, how this has influenced their mental health/well-being, and whether they intend to begin making any changes.  Intervention was effective, as evidenced by Kelsey Rojas participating in discussion, and reporting that she most identified with the avoidant attachment style  due to traits such as being guarded and distant, uncomfortable with emotions, and struggling to ask for help from others.  Kelsey Rojas reported that  this has led to consequences such as allowing mental health to suffer in silence due to fear of allowing herself to be vulnerable around others.  Kelsey Rojas stated "I never felt like I had someone to protect me.  I put up a wall".  Kelsey Rojas reported that she plans to choosing to spend more time around people that have heathy relationships, minimizing stress that can cause conflict with others, and letting her guard down to build trust with new connections.    Assessment and Plan: Counselor recommends that Kelsey Rojas remain in IOP treatment to better manage mental health symptoms, ensure stability and pursue completion of treatment plan goals. Counselor recommends adherence to crisis/safety plan, taking medications as prescribed, and following up with medical professionals if any issues arise.    Follow Up Instructions: Counselor will send [employer] Teams link for session tomorrow.  Kelsey Rojas was advised to call back or seek an in-person evaluation if the symptoms worsen or if the condition fails to improve as anticipated.   Collaboration of Care:   Medication Management AEB Kelsey Kerns, NP                                           Case Manager AEB Kelsey Gaskins, [name]    Patient/Guardian was advised Release of Information must be obtained prior to any record release in order to collaborate their care with an outside provider. Patient/Guardian was advised if they have not already done so to contact the registration department to sign all necessary forms in order for us  to release information regarding their care.    Consent: Patient/Guardian gives verbal consent for treatment and assignment of benefits for services provided during this visit. Patient/Guardian expressed understanding and agreed to proceed.   I provided 180 minutes of non-face-to-face time during this encounter.   Darleene Ricker, [place], LCAS 06/29/24

## 2024-06-30 ENCOUNTER — Other Ambulatory Visit (HOSPITAL_COMMUNITY)

## 2024-06-30 ENCOUNTER — Other Ambulatory Visit (HOSPITAL_COMMUNITY): Attending: Psychiatry | Admitting: Licensed Clinical Social Worker

## 2024-06-30 DIAGNOSIS — F331 Major depressive disorder, recurrent, moderate: Secondary | ICD-10-CM | POA: Diagnosis present

## 2024-06-30 NOTE — Progress Notes (Signed)
 Virtual Visit via Video Note   I connected with Nancee Sorrel, who prefers go by [name]" on 06/30/24 at  9:00 AM EDT by a video enabled telemedicine application and verified that I am speaking with the correct person using two identifiers.   At orientation to the IOP program, Case Manager discussed the limitations of evaluation and management by telemedicine and the availability of in person appointments. The patient expressed understanding and agreed to proceed with virtual visits throughout the duration of the program.   Location:  Patient: [facility] Home Provider: [facility]   History of Present Illness: MDD    Observations/Objective: Check In: Case Manager checked in with all participants to review discharge dates, insurance authorizations, work-related documents and needs from the treatment team regarding medications. [name] stated needs and engaged in discussion.    Initial Therapeutic Activity: Counselor facilitated a check-in with [name] to assess for safety, sobriety and medication compliance.  Counselor also inquired about [name]'s current emotional ratings, as well as any significant changes in thoughts, feelings or behavior since previous check in.  [name] presented for session on time and was alert, oriented x5, with no evidence or self-report of active SI/HI or A/V H.  [name] reported compliance with medication and denied use of alcohol or illicit substances.  [name] reported scores of 3/10 for depression, 7/10 for anxiety, and 6/10 for irritability.  [name] denied any recent outbursts or panic attacks.  [name] reported that a struggle has been continuing to struggle with cold symptoms.  [name] reported that a success was getting out of the house and gathering ingredients to make herself a nice dinner.  [name] reported that her goal today is to take a walk after group if she can get more energy.            Second Therapeutic Activity: Counselor introduced topic of self-care today.  Counselor explained how this can  be defined as the things one does to maintain good health and improve well-being.  Counselor provided members with a self-care assessment form to complete.  This handout featured various sub-categories of self-care, including physical, psychological/emotional, social, spiritual, and professional.  Members were asked to rank their engagement in the activities listed for each dimension on a scale of 1-3, with 1 indicating 'Poor', 2 indicating 'Ok', and 3 indicating 'Well'.  Counselor invited members to share results of their assessment, and inquired about which areas of self-care they are doing well in, as well as areas that require attention, and how they plan to begin addressing this during treatment.  Intervention was effective, as evidenced by [name] successfully completing initial 2 sections of assessment and actively engaging in discussion on subject, reporting that she is excelling in areas such as going to preventative medical appointments, wearing clothes that make her feel good, eating regularly, and doing something comforting, but would benefit from focusing more on areas such as taking time to rest when sick, getting enough sleep, participating in fun activities, talking about her problems, getting away from distractions, and learning new things unrelated to work or school.  [name] reported that she would work to improve self-care deficits by being more mindful of warning signs that she is getting sick and lightening her daily routine, establishing a healthier sleep wake routine to get more consistent rest at night, getting more physical activity by attending bootcamp or 'crossfit' classes, using group as a safe outlet to speak more openly about her feelings, and setting a healthier social media boundary with her phone.  Assessment and Plan: Counselor recommends that [name] remain in IOP treatment to better manage mental health symptoms, ensure stability and pursue completion of treatment plan goals. Counselor  recommends adherence to crisis/safety plan, taking medications as prescribed, and following up with medical professionals if any issues arise.    Follow Up Instructions: Counselor will send [employer] Teams link for session tomorrow.  [name] was advised to call back or seek an in-person evaluation if the symptoms worsen or if the condition fails to improve as anticipated.   Collaboration of Care:   Medication Management AEB Staci Kerns, NP                                           Case Manager AEB Ricka Gaskins, [name]    Patient/Guardian was advised Release of Information must be obtained prior to any record release in order to collaborate their care with an outside provider. Patient/Guardian was advised if they have not already done so to contact the registration department to sign all necessary forms in order for us  to release information regarding their care.    Consent: Patient/Guardian gives verbal consent for treatment and assignment of benefits for services provided during this visit. Patient/Guardian expressed understanding and agreed to proceed.   I provided 180 minutes of non-face-to-face time during this encounter.   Darleene Ricker, [place], LCAS 06/30/24

## 2024-07-01 ENCOUNTER — Encounter (HOSPITAL_COMMUNITY): Payer: Self-pay

## 2024-07-01 ENCOUNTER — Other Ambulatory Visit (HOSPITAL_COMMUNITY)

## 2024-07-01 ENCOUNTER — Other Ambulatory Visit (HOSPITAL_COMMUNITY): Attending: Psychiatry | Admitting: Licensed Clinical Social Worker

## 2024-07-01 DIAGNOSIS — F331 Major depressive disorder, recurrent, moderate: Secondary | ICD-10-CM | POA: Insufficient documentation

## 2024-07-01 MED ORDER — SERTRALINE HCL 100 MG PO TABS: 100.0000 mg | ORAL_TABLET | Freq: Every day | ORAL | 0 refills | Status: DC

## 2024-07-01 NOTE — Progress Notes (Signed)
 Virtual Visit via Video Note  I connected with Kelsey Rojas on 07/01/24 at  9:00 AM EDT by a video enabled telemedicine application and verified that I am speaking with the correct person using two identifiers.  Location: Patient: home Provider: office   I discussed the limitations of evaluation and management by telemedicine and the availability of in person appointments. The patient expressed understanding and agreed to proceed.   I discussed the assessment and treatment plan with the patient. The patient was provided an opportunity to ask questions and all were answered. The patient agreed with the plan and demonstrated an understanding of the instructions.   The patient was advised to call back or seek an in-person evaluation if the symptoms worsen or if the condition fails to improve as anticipated.  I provided 15 minutes of non-face-to-face time during this encounter.   Kelsey LOISE Kerns, NP   [facility] MD/PA/NP OP Progress Note  07/01/2024 11:56 AM Kelsey Rojas  MRN:  [id number]  Chief Complaint: Medication management  HPI: Kelsey Rojas 28 year old African-American Rojas seen and evaluated via virtual platform.  She reports overall her mood has stabilized.  She was initiated on Zoloft  50 mg however would like to titrate medication at this time.  Has ongoing lingering depressive symptoms.  No concerns related to suicidal or homicidal ideations.  Denies auditory or visual hallucinations.  She reports a good appetite.  States she is resting well throughout the night.  Will titrate medications at this time.  No other documented concerns.  It was reported that patient has been attending and participating in daily group session with active and engaged participation.   Kelsey Rojas is alert/oriented x 4; calm/cooperative; and mood congruent with affect.  Patient is speaking in a clear tone at moderate volume, and normal pace; with good eye contact. Her thought process is coherent and relevant.   Patient denies suicidal/self-harm/homicidal ideation, psychosis, and paranoia.  Patient has remained calm throughout assessment and has answered questions appropriatel   Visit Diagnosis:    ICD-10-CM   1. MDD (major depressive disorder), recurrent episode, moderate (HCC)  F33.1       Past Psychiatric History: Therapy and psychiatry services  Past Medical History:  Past Medical History:  Diagnosis Date   GERD (gastroesophageal reflux disease)    Macromastia 09/2018   Migraines    Morbid obesity (HCC)    OSA on CPAP     Past Surgical History:  Procedure Laterality Date   BREAST REDUCTION SURGERY Bilateral 10/12/2018   Procedure: BILATERAL MAMMARY REDUCTION  (BREAST);  Surgeon: Rojas, Kelsey Caldron, MD;  Location: [facility] OR;  Service: Plastics;  Laterality: Bilateral;  BILATERAL MAMMARY REDUCTION  (BREAST)   CHOLECYSTECTOMY, LAPAROSCOPIC  04/10/2022   UPPER GASTROINTESTINAL ENDOSCOPY      Family Psychiatric History:   Family History:  Family History  Problem Relation Age of Onset   Depression Mother    Anxiety disorder Mother    Hypertension Mother    Migraines Mother    Cancer Father    Hypertension Father    Migraines Paternal Grandmother    Alcohol abuse Paternal Grandfather     Social History:  Social History   Socioeconomic History   Marital status: Single    Spouse name: Not on file   Number of children: 0   Years of education: Not on file   Highest education level: Bachelor's degree (e.g., BA, AB, BS)  Occupational History   Not on file  Tobacco Use   Smoking status: Never  Smokeless tobacco: Never  Vaping Use   Vaping status: Never Used  Substance and Sexual Activity   Alcohol use: Not Currently    Alcohol/week: 1.0 standard drink of alcohol    Types: 1 Glasses of wine per week    Comment: occasionally   Drug use: No   Sexual activity: Yes    Birth control/protection: None  Other Topics Concern   Not on file  Social History Narrative   Pt  lives alone    Pt works    Social Drivers of Corporate investment banker Strain: Medium Risk (03/23/2024)   Received from [facility]   Overall Financial Resource Strain (CARDIA)    Difficulty of Paying Living Expenses: Somewhat hard  Food Insecurity: No Food Insecurity (03/23/2024)   Received from [facility]   Hunger Vital Sign    Within the past 12 months, you worried that your food would run out before you got the money to buy more.: Never true    Within the past 12 months, the food you bought just didn't last and you didn't have money to get more.: Never true  Transportation Needs: No Transportation Needs (03/23/2024)   Received from [facility] - Transportation    Lack of Transportation (Medical): No    Lack of Transportation (Non-Medical): No  Physical Activity: Insufficiently Active (03/23/2024)   Received from [facility]   Exercise Vital Sign    On average, how many days per week do you engage in moderate to strenuous exercise (like a brisk walk)?: 3 days    On average, how many minutes do you engage in exercise at this level?: 30 min  Stress: No Stress Concern Present (03/23/2024)   Received from [facility] - Occupational Stress Questionnaire    Feeling of Stress : Only a little  Social Connections: Moderately Integrated (03/23/2024)   Received from [facility]   Social Network    How would you rate your social network (family, work, friends)?: Adequate participation with social networks    Allergies:  Allergies  Allergen Reactions   Penicillins Rash and Hives    Has patient had a PCN reaction causing immediate rash, facial/tongue/throat swelling, SOB or lightheadedness with hypotension: No Has patient had a PCN reaction causing severe rash involving mucus membranes or skin necrosis: No Has patient had a PCN reaction that required hospitalization: No Has patient had a PCN reaction occurring within the last 10  years: #  #  #  YES  #  #  #  If all of the above answers are NO, then may proceed with Cephalosporin use.  Has patient had a PCN reaction causing immediate rash, facial/tongue/throat swelling, SOB or lightheadedness with hypotension: No Has patient had a PCN reaction causing severe rash involving mucus membranes or skin necrosis: No Has patient had a PCN reaction that required hospitalization: No Has patient had a PCN reaction occurring within the last 10 years: #  #  #  YES  #  #  #  If all of the above answers are NO, then may proceed with Cephalosporin use.   Ciprofloxacin Hives    Family history of a reaction UNSPECIFIED FAMILY REACTION Family history of a reaction UNSPECIFIED FAMILY REACTION   Amoxicillin  Rash   Qulipta [Atogepant] Hives and Rash    Metabolic Disorder Labs: No results found for: HGBA1C, MPG No results found for: PROLACTIN No results found for: CHOL, TRIG, HDL, CHOLHDL,  VLDL, LDLCALC No results found for: TSH  Therapeutic Level Labs: No results found for: LITHIUM No results found for: VALPROATE No results found for: CBMZ  Current Medications: Current Outpatient Medications  Medication Sig Dispense Refill   sertraline  (ZOLOFT ) 100 MG tablet Take 1 tablet (100 mg total) by mouth daily. 60 tablet 0   ondansetron  (ZOFRAN -ODT) 4 MG disintegrating tablet Take 1 tablet (4 mg total) by mouth every 8 (eight) hours as needed. 20 tablet 0   rizatriptan  (MAXALT -MLT) 10 MG disintegrating tablet Take 1 tablet (10 mg total) by mouth as needed for migraine. May repeat in 2 hours if needed 9 tablet 11   No current facility-administered medications for this visit.     Musculoskeletal: Virtual assessment   Psychiatric Specialty Exam: Review of Systems  There were no vitals taken for this visit.There is no height or weight on file to calculate BMI.  General Appearance: NA  Eye Contact:  NA  Speech:  Clear and Coherent  Volume:  Normal   Mood:  Depressed  Affect:  Congruent  Thought Process:  Coherent  Orientation:  Full (Time, Place, and Person)  Thought Content: Logical   Suicidal Thoughts:  No  Homicidal Thoughts:  No  Memory:  Immediate;   Fair Recent;   Fair  Judgement:  Good  Insight:  Good  Psychomotor Activity:  Normal  Concentration:  Concentration: Good  Recall:  Good  Fund of Knowledge: Good  Language: Good  Akathisia:  No  Handed:  Right  AIMS (if indicated): not done  Assets:  Communication Skills Desire for Improvement  ADL's:  Intact  Cognition: WNL  Sleep:  Fair   Screenings: GAD-7    Advertising copywriter from 06/14/2024 in BEHAVIORAL HEALTH INTENSIVE PSYCH  Total GAD-7 Score 14   PHQ2-9    Flowsheet Row Counselor from 06/14/2024 in BEHAVIORAL HEALTH INTENSIVE PSYCH Counselor from 01/28/2022 in BEHAVIORAL HEALTH INTENSIVE PSYCH  PHQ-2 Total Score 4 6  PHQ-9 Total Score 17 19   Flowsheet Row ED from 12/23/2023 in [facility] Emergency Department at [facility] ED from 06/19/2023 in [facility] Emergency Department at [facility] Counselor from 01/28/2022 in BEHAVIORAL HEALTH INTENSIVE PSYCH  C-SSRS RISK CATEGORY No Risk No Risk Error: Question 6 not populated     Assessment and Plan:  Continue Intensive outpatient programming (IOP) Increase Zoloft  50 mg to 100 mg daily  Collaboration of Care: Collaboration of Care: Medication Management AEB increase Zoloft   Patient/Guardian was advised Release of Information must be obtained prior to any record release in order to collaborate their care with an outside provider. Patient/Guardian was advised if they have not already done so to contact the registration department to sign all necessary forms in order for us  to release information regarding their care.   Consent: Patient/Guardian gives verbal consent for treatment and assignment of benefits for services provided during this visit. Patient/Guardian expressed understanding and  agreed to proceed.    Kelsey LOISE Kerns, NP 07/01/2024, 11:56 AM

## 2024-07-01 NOTE — Progress Notes (Signed)
 Virtual Visit via Video Note   I connected with Kelsey Rojas, who prefers go by [name]" on 07/01/24 at  9:00 AM EDT by a video enabled telemedicine application and verified that I am speaking with the correct person using two identifiers.   At orientation to the IOP program, Case Manager discussed the limitations of evaluation and management by telemedicine and the availability of in person appointments. The patient expressed understanding and agreed to proceed with virtual visits throughout the duration of the program.   Location:  Patient: Patient Home Provider: Home Office   History of Present Illness: MDD   Observations/Objective: Check In: Case Manager checked in with all participants to review discharge dates, insurance authorizations, work-related documents and needs from the treatment team regarding medications. Kelsey Rojas stated needs and engaged in discussion.    Initial Therapeutic Activity: Counselor facilitated a check-in with Kelsey Rojas to assess for safety, sobriety and medication compliance.  Counselor also inquired about Kelsey Rojas's current emotional ratings, as well as any significant changes in thoughts, feelings or behavior since previous check in.  Kelsey Rojas presented for session on time and was alert, oriented x5, with no evidence or self-report of active SI/HI or A/V H.  Kelsey Rojas reported compliance with medication and denied use of alcohol or illicit substances.  Kelsey Rojas reported scores of 2/10 for depression, 4/10 for anxiety, and 5/10 for irritability.  Kelsey Rojas denied any recent outbursts or panic attacks.  Kelsey Rojas reported that a struggle was oversharing some family members talk about her behind her back, stating "It wasn't negative, but it wasn't supportive".  Kelsey Rojas reported that a success was getting a lot of rest yesterday.  Kelsey Rojas reported that her goal this weekend is to attend a game night with friends if she isn't too tired.          Second Therapeutic Activity: Psycho-educational portion of group was provided by  Lorane Rochester, Interior and spatial designer of community education with [employer].  [name] provided information on history of her local agency, mission statement, and the variety of unique services offered which group members might find beneficial to engage in, including both virtual and in-person support groups, as well as peer support program for mentoring.  [name] offered time to answer member's questions regarding services and encouraged them to consider utilizing these services to assist in working towards their individual wellness goals.  Intervention was effective, as evidenced by Kelsey Rojas participating in discussion with speaker on the subject, reporting that she would be interested in joining one of the women's support groups.    Third Therapeutic Activity: Counselor introduced topic of building a social support network today.  Counselor explained how this can be defined as having a having a group of healthy people in one's life you can talk to, spend time with, and get help from to improve both mental and physical health.  Counselor noted that some barriers can make it difficult to connect with other people, including the presence of anxiety or depression, or moving to an unfamiliar area.  Group members were asked to assess the current state of their support network, and identify ways that this could be improved.  Tips were given on how to address previously noted barriers, such as strengthening social skills, using relaxation techniques to reduce anxiety, scheduling social time each week, and/or exploring social events nearby which could increase chances of meeting new supports.  Members were also encouraged to consider getting closer to people they already know through suggestions such as outreaching someone by text, email or phone  call if they haven't spoken in awhile, doing something nice for a friend/family member unexpectedly, and/or inviting someone over for a game/movie/dinner night.  Intervention was  effective, as evidenced by Kelsey Rojas actively participating in discussion on the subject, and reporting that she does have some weak aspects of her support network, and could stand to strengthen this during treatment.  She reported that one problem is canceling plans with friends or family without good reason, so she intends to follow through more often when plans are offered, and only cancel when she has a valid reason, such as being sick.  Kelsey Rojas stated "There is definitely work to be done".  She reported that her goal will be to meet new friends by participating in local events and activities such as work out or Optometrist.      Assessment and Plan: Counselor recommends that Kelsey Rojas remain in IOP treatment to better manage mental health symptoms, ensure stability and pursue completion of treatment plan goals. Counselor recommends adherence to crisis/safety plan, taking medications as prescribed, and following up with medical professionals if any issues arise.    Follow Up Instructions: Counselor will send [employer] Teams link for session tomorrow.  Kelsey Rojas was advised to call back or seek an in-person evaluation if the symptoms worsen or if the condition fails to improve as anticipated.   Collaboration of Care:   Medication Management AEB Staci Kerns, NP                                           Case Manager AEB Ricka Gaskins, [name]    Patient/Guardian was advised Release of Information must be obtained prior to any record release in order to collaborate their care with an outside provider. Patient/Guardian was advised if they have not already done so to contact the registration department to sign all necessary forms in order for us  to release information regarding their care.    Consent: Patient/Guardian gives verbal consent for treatment and assignment of benefits for services provided during this visit. Patient/Guardian expressed understanding and agreed to proceed.   I provided 180 minutes of non-face-to-face  time during this encounter.   Darleene Ricker, LCSW, LCAS 07/01/24

## 2024-07-04 ENCOUNTER — Other Ambulatory Visit (HOSPITAL_COMMUNITY): Attending: Psychiatry | Admitting: Family

## 2024-07-04 ENCOUNTER — Ambulatory Visit (HOSPITAL_COMMUNITY)

## 2024-07-04 DIAGNOSIS — F331 Major depressive disorder, recurrent, moderate: Secondary | ICD-10-CM | POA: Insufficient documentation

## 2024-07-04 NOTE — Progress Notes (Signed)
 Virtual Visit via Video Note   I connected with Kelsey Rojas, who prefers go by Kelsey Rojas" on 07/04/24 at  9:00 AM EDT by a video enabled telemedicine application and verified that I am speaking with the correct person using two identifiers.   At orientation to the IOP program, Case Manager discussed the limitations of evaluation and management by telemedicine and the availability of in person appointments. The patient expressed understanding and agreed to proceed with virtual visits throughout the duration of the program.   Location:  Patient: Patient Home Provider: OPT BH Office   History of Present Illness: MDD    Observations/Objective: Check In: Case Manager checked in with all participants to review discharge dates, insurance authorizations, work-related documents and needs from the treatment team regarding medications. Kelsey Rojas stated needs and engaged in discussion.    Initial Therapeutic Activity: Counselor facilitated a check-in with Kelsey Rojas to assess for safety, sobriety and medication compliance.  Counselor also inquired about Kelsey Rojas's current emotional ratings, as well as any significant changes in thoughts, feelings or behavior since previous check in.  Kelsey Rojas presented for session on time and was alert, oriented x5, with no evidence or self-report of active SI/HI or A/V H.  Kelsey Rojas reported compliance with medication and denied use of alcohol or illicit substances.  Kelsey Rojas reported scores of 1/10 for depression, 7/10 for anxiety, and 7/10 for anger/irritability.  Kelsey Rojas denied any recent panic attacks.  Kelsey Rojas reported that a struggle was having an outburst yesterday, stating "I had a very busy weekend".  Kelsey Rojas reported that a success was having game night with some friends for self-care.  Kelsey Rojas reported that her goal today is to move some furniture around the house after group.         Second Therapeutic Activity: Counselor introduced topic of stress management today.  Counselor provided definition of stress as feeling  tense, overwhelmed, worn out, and/or exhausted, and noted that in small amounts, stress can be motivating until things become too overwhelming to manage.  Counselor also explained how stress can be acute (brief but intense) or chronic (long-lasting) and this can impact the severity of symptoms one can experience in the physical, emotional, and behavioral categories.  Counselor inquired about members' specific stressors, how long they have been prevalent, and the various symptoms that tend to manifest as a result.  Counselor also offered several stress management strategies to help improve members' coping ability, including journaling, gratitude practice, relaxation techniques, and time management tips.  Counselor also explained that research has shown a strong support network composed of trusted family, friends, or community members can increase resilience in times of stress, and inquired about who members can reach out to for help in managing stressors.  Counselor encouraged members to consider discussing stressor 'red flags' with their close supports that can be monitored and strategies for assisting them in times of crisis.  Intervention was effective, as evidenced by Kelsey Rojas actively participating in discussion on subject, reporting that her most significant stressors include money worries, pain and fatigue.  Kelsey Rojas was able to identify several warning signs related to stress, including high blood pressure, migraines, going quiet around others, and forgetfulness.  Kelsey Rojas reported that her stress management goal is to check in with herself each day to determine how stressed she is, and be more proactive about practicing relaxation skills to keep stress level down.  Kelsey Rojas also expressed receptiveness to several stress management strategies practiced today in session, including using a stress tracker in a journal to identify  patterns in daily stressors, deep breathing, and progressive muscle relaxation.     Assessment and  Plan: Counselor recommends that Kelsey Rojas remain in IOP treatment to better manage mental health symptoms, ensure stability and pursue completion of treatment plan goals. Counselor recommends adherence to crisis/safety plan, taking medications as prescribed, and following up with medical professionals if any issues arise.    Follow Up Instructions: Counselor will send [employer] Teams link for session tomorrow.  Kelsey Rojas was advised to call back or seek an in-person evaluation if the symptoms worsen or if the condition fails to improve as anticipated.   Collaboration of Care:   Medication Management AEB Staci Kerns, NP                                           Case Manager AEB Ricka Gaskins, [name]    Patient/Guardian was advised Release of Information must be obtained prior to any record release in order to collaborate their care with an outside provider. Patient/Guardian was advised if they have not already done so to contact the registration department to sign all necessary forms in order for us  to release information regarding their care.    Consent: Patient/Guardian gives verbal consent for treatment and assignment of benefits for services provided during this visit. Patient/Guardian expressed understanding and agreed to proceed.   I provided 180 minutes of non-face-to-face time during this encounter.   Darleene Ricker, LCSW, LCAS 07/04/24

## 2024-07-05 ENCOUNTER — Other Ambulatory Visit (HOSPITAL_COMMUNITY): Attending: Psychiatry | Admitting: Licensed Clinical Social Worker

## 2024-07-05 ENCOUNTER — Other Ambulatory Visit (HOSPITAL_COMMUNITY)

## 2024-07-05 DIAGNOSIS — F331 Major depressive disorder, recurrent, moderate: Secondary | ICD-10-CM | POA: Insufficient documentation

## 2024-07-05 MED ORDER — HYDROXYZINE PAMOATE 25 MG PO CAPS: 25.0000 mg | ORAL_CAPSULE | Freq: Every evening | ORAL | 0 refills | Status: DC | PRN

## 2024-07-05 NOTE — Progress Notes (Signed)
 Virtual Visit via Video Note   I connected with Kelsey Rojas, who prefers go by Kelsey Rojas" on 07/05/24 at  9:00 AM EDT by a video enabled telemedicine application and verified that I am speaking with the correct person using two identifiers.   At orientation to the IOP program, Case Manager discussed the limitations of evaluation and management by telemedicine and the availability of in person appointments. The patient expressed understanding and agreed to proceed with virtual visits throughout the duration of the program.   Location:  Patient: Patient Home Provider: [facility]   History of Present Illness: MDD    Observations/Objective: Check In: Case Manager checked in with all participants to review discharge dates, insurance authorizations, work-related documents and needs from the treatment team regarding medications. Kelsey Rojas stated needs and engaged in discussion.    Initial Therapeutic Activity: Counselor facilitated a check-in with Kelsey Rojas to assess for safety, sobriety and medication compliance.  Counselor also inquired about Kelsey Rojas's current emotional ratings, as well as any significant changes in thoughts, feelings or behavior since previous check in.  Kelsey Rojas presented for session on time and was alert, oriented x5, with no evidence or self-report of active SI/HI or A/V H.  Kelsey Rojas reported compliance with medication and denied use of alcohol or illicit substances.  Kelsey Rojas reported scores of 2/10 for depression, 6/10 for anxiety, and 7/10 for irritability.  Kelsey Rojas denied any recent outbursts or panic attacks.  Kelsey Rojas reported that a struggle was having to take a nap yesterday due to fatigue.  She reported that a success was having friends come to visit, watch movies, and eat ice cream for self-care.  Kelsey Rojas reported that her goal today is to work on her to-do list after group, including gathering clothes to donate.          Second Therapeutic Activity: Counselor introduced Kelsey Rojas, [facility] Chaplain to provide  psychoeducation on topic of Grief and Loss with members today.  Kelsey Rojas began discussion by checking in with the group about their baseline mood today, general thoughts on what grief means to them and how it has affected them personally in the past.  Kelsey Rojas provided information on how the process of grief/loss can differ depending upon one's unique culture, and categories of loss one could experience (i.e. loss of a person, animal, relationship, job, identity, etc).  Kelsey Rojas encouraged members to be mindful of how pervasive loss can be, and how to recognize signs which could indicate that this is having an impact on one's overall mental health and wellbeing.  Intervention was effective, as evidenced by Kelsey Rojas participating in discussion with speaker on the subject, reporting that a recent loss she experienced was having to switch churches since leadership changed and no longer fit with her family values.  Kelsey Rojas reported that some family members stuck with this church, so she has also missed seeing them as often.  Kelsey Rojas was receptive to empathetic feedback from chaplain.    Third Therapeutic Activity: Counselor covered topic of conflict resolution today.  Counselor virtually shared a handout on subject with members which warned against the 'four horsemen' of communication traps that should be avoided due to tendency to escalate and damage a relationship.  These included criticism, defensiveness, contempt, and stonewalling.  'Antidotes' to these harmful behaviors were offered as healthy replacements to improve communication and understanding, including approaching problems with a gentle startup approach, taking responsibility for one's behavior, sharing fondness/admiration, and using self-soothing to calm down and focus on the problem at hand.  Counselor encouraged members to share recent experiences with conflict that they have faced, which approach they utilized, and any changes that they would plan to implement in order  to improve overall conflict resolution skills.  Intervention was effective, as evidenced by Kelsey Rojas actively participating in discussion on topic, reporting that she has engaged in only one of these communication traps, which was stonewalling.  Kelsey Rojas reported that she has dealt with people that can be critical and contemptuous, but since starting therapy she has begun to set healthier boundaries with her network in order to avoiding shutting down, and more clearly express her thoughts, feelings, and needs.  Kelsey Rojas reported that she has found that this also cuts down on negative thinking, and self-criticism in areas like her body image.   Assessment and Plan: Counselor recommends that Kelsey Rojas remain in IOP treatment to better manage mental health symptoms, ensure stability and pursue completion of treatment plan goals. Counselor recommends adherence to crisis/safety plan, taking medications as prescribed, and following up with medical professionals if any issues arise.    Follow Up Instructions: Counselor will send Microsoft Teams link for session tomorrow.  Kelsey Rojas was advised to call back or seek an in-person evaluation if the symptoms worsen or if the condition fails to improve as anticipated.   Collaboration of Care:   Medication Management AEB Kelsey Kerns, NP                                           Case Manager AEB Kelsey Gaskins, [name]    Patient/Guardian was advised Release of Information must be obtained prior to any record release in order to collaborate their care with an outside provider. Patient/Guardian was advised if they have not already done so to contact the registration department to sign all necessary forms in order for us  to release information regarding their care.    Consent: Patient/Guardian gives verbal consent for treatment and assignment of benefits for services provided during this visit. Patient/Guardian expressed understanding and agreed to proceed.   I provided 180 minutes of  non-face-to-face time during this encounter.   Kelsey Ricker, LCSW, LCAS 07/05/24

## 2024-07-05 NOTE — Addendum Note (Signed)
 Addended by: EZZARD STACI SAILOR on: 07/05/2024 10:25 AM   Modules accepted: Orders

## 2024-07-06 ENCOUNTER — Other Ambulatory Visit (HOSPITAL_COMMUNITY): Attending: Psychiatry | Admitting: Licensed Clinical Social Worker

## 2024-07-06 DIAGNOSIS — F331 Major depressive disorder, recurrent, moderate: Secondary | ICD-10-CM | POA: Diagnosis present

## 2024-07-06 NOTE — Progress Notes (Signed)
 Virtual Visit via Video Note   I connected with Kelsey Rojas, who prefers go by Kelsey Rojas" on 07/06/24 at  9:00 AM EDT by a video enabled telemedicine application and verified that I am speaking with the correct person using two identifiers.   At orientation to the IOP program, Case Manager discussed the limitations of evaluation and management by telemedicine and the availability of in person appointments. The patient expressed understanding and agreed to proceed with virtual visits throughout the duration of the program.   Location:  Patient: Patient Home Provider: [facility]   History of Present Illness: MDD    Observations/Objective: Check In: Case Manager checked in with all participants to review discharge dates, insurance authorizations, work-related documents and needs from the treatment team regarding medications. Kelsey Rojas stated needs and engaged in discussion.    Initial Therapeutic Activity: Counselor facilitated a check-in with Kelsey Rojas to assess for safety, sobriety and medication compliance.  Counselor also inquired about Kelsey Rojas's current emotional ratings, as well as any significant changes in thoughts, feelings or behavior since previous check in.  Kelsey Rojas presented for session on time and was alert, oriented x5, with no evidence or self-report of active SI/HI or A/V H.  Kelsey Rojas reported compliance with medication and denied use of alcohol or illicit substances.  Kelsey Rojas reported scores of 0/10 for depression, 5/10 for anxiety, and 0/10 for anger/irritability.  Kelsey Rojas denied any recent outbursts or panic attacks.  Kelsey Rojas reported that a struggle was staying up late last night until 6am.  Kelsey Rojas reported that a success was having a manicure yesterday for self-care, stating "That was amazing".  Kelsey Rojas reported that she is also working on organizing her closet and deciding on things to donate in order to declutter.  Kelsey Rojas reported that her goal today is to pick up a prescription from the provider to help with her irregular  sleep problems.          Second Therapeutic Activity: Counselor introduced Kelsey Rojas, [facility] Nutritionist, to provide psychoeducation on topic of nutrition with members today.  Kelsey virtually shared a comprehensive PowerPoint presentation to guide discussion, which featured the various components of healthy living that influence one's wellbeing, including practicing mindfulness, staying physically active, calm in mood, well-rested, and more.  Kelsey explained how good nutrition reinforces positive physical and mental health, and shared a video which explained how food intake affects brain functioning in particular.  Kelsey provided advice on how to adjust diet in order to promote wellbeing during course of treatment, including achieving balanced daily intake along with regular exercise.  Kelsey offered the 'plate method' as a tool for proper distribution of protein, grains, starches, vegetables, fruit, and low calorie drink, in addition to concept of 'mindful eating', and covering current Dietary Guidelines for Americans for more tips.  Kelsey inquired about changes members would like to make to their nutrition in order to increase overall wellbeing based upon information shared today, and time was allowed to ask any questions they might have of Kelsey regarding her specialty.  Intervention was effective, as evidenced by Kelsey Rojas participating in discussion with speaker on the subject, reporting that one area of health she is actively trying to improve is water intake, including setting a goal of drinking 80oz per day. She was also receptive to implementing mindful eating strategy in order to avoid 'stress eating', which she has identified as a bad habit.  Kelsey Rojas was also observed engaging in a stretching activity guided by speaker to increase overall physical activity.  Assessment and Plan: Counselor recommends that Kelsey Rojas remain in IOP treatment to better manage mental health symptoms, ensure stability and pursue completion of  treatment plan goals. Counselor recommends adherence to crisis/safety plan, taking medications as prescribed, and following up with medical professionals if any issues arise.    Follow Up Instructions: Counselor will send Microsoft Teams link for session tomorrow.  Kelsey Rojas was advised to call back or seek an in-person evaluation if the symptoms worsen or if the condition fails to improve as anticipated.   Collaboration of Care:   Medication Management AEB Kelsey Kerns, NP                                           Case Manager AEB Kelsey Gaskins, [name]    Patient/Guardian was advised Release of Information must be obtained prior to any record release in order to collaborate their care with an outside provider. Patient/Guardian was advised if they have not already done so to contact the registration department to sign all necessary forms in order for us  to release information regarding their care.    Consent: Patient/Guardian gives verbal consent for treatment and assignment of benefits for services provided during this visit. Patient/Guardian expressed understanding and agreed to proceed.   I provided 180 minutes of non-face-to-face time during this encounter.   Kelsey Ricker, LCSW, LCAS 07/06/24

## 2024-07-07 ENCOUNTER — Other Ambulatory Visit (HOSPITAL_COMMUNITY): Attending: Psychiatry | Admitting: Psychiatry

## 2024-07-07 DIAGNOSIS — F331 Major depressive disorder, recurrent, moderate: Secondary | ICD-10-CM | POA: Diagnosis present

## 2024-07-07 NOTE — Progress Notes (Signed)
 Virtual Visit via Video Note   I connected with Kelsey Rojas, who prefers go by Kelsey Rojas" on 07/07/24 at  9:00 AM EDT by a video enabled telemedicine application and verified that I am speaking with the correct person using two identifiers.   At orientation to the IOP program, Case Manager discussed the limitations of evaluation and management by telemedicine and the availability of in person appointments. The patient expressed understanding and agreed to proceed with virtual visits throughout the duration of the program.   Location:  Patient: Patient Home Provider: [facility]   History of Present Illness: MDD    Observations/Objective: Check In: Case Manager checked in with all participants to review discharge dates, insurance authorizations, work-related documents and needs from the treatment team regarding medications. Kelsey Rojas stated needs and engaged in discussion.    Initial Therapeutic Activity: Counselor facilitated a check-in with Kelsey Rojas to assess for safety, sobriety and medication compliance.  Counselor also inquired about Kelsey Rojas's current emotional ratings, as well as any significant changes in thoughts, feelings or behavior since previous check in.  Kelsey Rojas presented for session on time and was alert, oriented x5, with no evidence or self-report of active SI/HI or A/V H.  Kelsey Rojas reported compliance with medication and denied use of alcohol or illicit substances.  Kelsey Rojas reported scores of 2/10 for depression, 6/10 for anxiety, and 4/10 for anger/irritability.  Kelsey Rojas denied any recent outbursts or panic attacks.  Kelsey Rojas reported that a struggle was staying awake for nearly 48 hours over the past day.  Kelsey Rojas stated "I did sleep from 12:30am until 7:45am last night".  Kelsey Rojas reported that a success was going shopping yesterday for self-care, stating "I got some good deals".  Kelsey Rojas reported that her goal today is to go grocery shopping and clean out a Child psychotherapist.          Second Therapeutic Activity: Counselor introduced  topic of grounding skills today.  Counselor defined these as simple strategies one can use to help detach from difficult thoughts or feelings temporarily by focusing on something else.  Counselor noted that grounding will not solve the problem at hand, but can provide the practitioner with time to regain control over their thoughts and/or feelings and prevent the situation from getting worse (i.e. interrupting a panic attack).  Counselor divided these into three categories (mental, physical, and soothing) and then provided examples of each which group members could practice during session.  Some of these included describing one's environment in detail or playing a categories game with oneself for mental category, taking a hot bath/shower, stretching, or carrying a grounding object for physical category, and saying kind statements, or visualizing people one cares about for soothing category.  Counselor inquired about which techniques members have used with success in the past, or will commit to learning, practicing, and applying now to improve coping abilities.  Intervention was effective, as evidenced by Kelsey Rojas participating in discussion on the subject, trying out several of the techniques during session, and expressing interest in adding several to her available coping skills, such as describing her environment in detail, playing a categories game involving listing music artists, imagining relaxing at a warm campfire, reading an engrossing horror story, watching a video of a favorite standup comedian, reciting the days of the week in Spanish, putting her hands in cold ice water, taking a hot bath, rubbing her legs,  putting together a puzzle, and doing a yoga or Pilates routine.   Assessment and Plan: Counselor recommends that Kelsey Rojas remain  in IOP treatment to better manage mental health symptoms, ensure stability and pursue completion of treatment plan goals. Counselor recommends adherence to crisis/safety plan,  taking medications as prescribed, and following up with medical professionals if any issues arise.    Follow Up Instructions: Counselor will send Microsoft Teams link for session tomorrow.  Kelsey Rojas was advised to call back or seek an in-person evaluation if the symptoms worsen or if the condition fails to improve as anticipated.   Collaboration of Care:   Medication Management AEB Staci Kerns, NP                                           Case Manager AEB Ricka Gaskins, [name]    Patient/Guardian was advised Release of Information must be obtained prior to any record release in order to collaborate their care with an outside provider. Patient/Guardian was advised if they have not already done so to contact the registration department to sign all necessary forms in order for us  to release information regarding their care.    Consent: Patient/Guardian gives verbal consent for treatment and assignment of benefits for services provided during this visit. Patient/Guardian expressed understanding and agreed to proceed.   I provided 180 minutes of non-face-to-face time during this encounter.   Darleene Ricker, LCSW, LCAS 07/07/24

## 2024-07-07 NOTE — Patient Instructions (Signed)
 D:  Patient will complete MH-IOP tomorrow (07-08-24).  A:  Follow up with Dr. Carvin on 09-06-24 2 2pm (arrive 15 minutes early if you haven't completed the paperwork that Ricka provided you with).  Strongly recommend support groups through [facility].  Return to work on 07-11-24.  R:  Pt receptive.

## 2024-07-08 ENCOUNTER — Encounter (HOSPITAL_COMMUNITY): Payer: Self-pay | Admitting: Psychiatry

## 2024-07-08 ENCOUNTER — Other Ambulatory Visit (HOSPITAL_COMMUNITY): Attending: Psychiatry | Admitting: Psychiatry

## 2024-07-08 DIAGNOSIS — F32A Depression, unspecified: Secondary | ICD-10-CM | POA: Insufficient documentation

## 2024-07-08 DIAGNOSIS — Z79899 Other long term (current) drug therapy: Secondary | ICD-10-CM | POA: Diagnosis not present

## 2024-07-08 DIAGNOSIS — F419 Anxiety disorder, unspecified: Secondary | ICD-10-CM | POA: Insufficient documentation

## 2024-07-08 DIAGNOSIS — F331 Major depressive disorder, recurrent, moderate: Secondary | ICD-10-CM

## 2024-07-08 NOTE — Progress Notes (Signed)
 Virtual Visit via Video Note   I connected with Kelsey Rojas, who prefers go by Kelsey Rojas" on 07/08/24 at  9:00 AM EDT by a video enabled telemedicine application and verified that I am speaking with the correct person using two identifiers.   At orientation to the IOP program, Case Manager discussed the limitations of evaluation and management by telemedicine and the availability of in person appointments. The patient expressed understanding and agreed to proceed with virtual visits throughout the duration of the program.   Location:  Patient: Patient Home Provider: Home Office   History of Present Illness: MDD   Observations/Objective: Check In: Case Manager checked in with all participants to review discharge dates, insurance authorizations, work-related documents and needs from the treatment team regarding medications. Kelsey Rojas stated needs and engaged in discussion.    Initial Therapeutic Activity: Counselor facilitated a check-in with Kelsey Rojas to assess for safety, sobriety and medication compliance.  Counselor also inquired about [name]'s current emotional ratings, as well as any significant changes in thoughts, feelings or behavior since previous check in.  Kelsey Rojas presented for session on time and was alert, oriented x5, with no evidence or self-report of active SI/HI or A/V H.  Kelsey Rojas reported compliance with medication and denied use of alcohol or illicit substances.  Kelsey Rojas reported scores of 3/10 for depression, 4/10 for anxiety, and 0/10 for anger/irritability.  Kelsey Rojas denied any recent outbursts or panic attacks.  Kelsey Rojas reported that a struggle was limiting her nap yesterday afternoon.  Kelsey Rojas reported that a success was getting groceries yesterday, and making a breakfast casserole.  Kelsey Rojas reported that her goal this weekend is to get some rest since she hasn't been sleeping well, and attend a baseball game where there will be fireworks.            Second Therapeutic Activity: Counselor provided psychoeducation on  subject of boundaries with group members today using a virtual handout.  This handout defined boundaries as the limits and rules that we set for ourselves within relationships, and featured a breakdown of the 3 common categories of boundaries (i.e. porous, rigid, and healthy), along with typical traits specific to each one for easy identification.  It was noted that most people have a mixture of different boundary types depending on setting, person, and culture.  Additional information was provided on the types of boundaries (i.e. physical, intellectual, emotional, sexual, material, and time) within relationships, and what could be considered healthy versus unhealthy. Counselor tasked members with identifying what types of boundaries they presently hold within her own support systems, the collective impact these boundaries have upon their mental health, and changes that could be made in order to more effectively communicate individual mental health needs.  Intervention was effective, as evidenced by Kelsey Rojas actively engaging in discussion on topic, reporting that she has porous boundaries due to traits such as oversharing personal information, overinvolved in other's problems, dependent on the opinions of others, and fearing rejection from others.  Kelsey Rojas reported that this has been especially problematic with her parents, who will share her personal information with others without asking.  Kelsey Rojas reported that she would work to improve boundaries by being more selective about who she opens up to, and clarifying what her specific boundaries are with those that have crossed them in the past.    Assessment and Plan: Kelsey Rojas has completed MHIOP and will be discharged today.  Counselor recommends adherence to crisis/safety plan, taking medications as prescribed, and following up with medical professionals if any issues  arise.    Follow Up Instructions: Kelsey Rojas was advised to call back or seek an in-person evaluation if the symptoms  worsen or if the condition fails to improve as anticipated.   Collaboration of Care:   Medication Management AEB Staci Kerns, NP                                           Case Manager AEB Ricka Gaskins, [name]    Patient/Guardian was advised Release of Information must be obtained prior to any record release in order to collaborate their care with an outside provider. Patient/Guardian was advised if they have not already done so to contact the registration department to sign all necessary forms in order for us  to release information regarding their care.    Consent: Patient/Guardian gives verbal consent for treatment and assignment of benefits for services provided during this visit. Patient/Guardian expressed understanding and agreed to proceed.   I provided 180 minutes of non-face-to-face time during this encounter.   Darleene Ricker, LCSW, LCAS 07/08/24

## 2024-07-08 NOTE — Progress Notes (Signed)
  [facility] Intensive Outpatient Program Discharge Summary  Kelsey Rojas [id number]  Admission date: 06/14/2024 Discharge date: 07/08/2024  Reason for admission: depression and anxiety  Chemical Use History: none  Family of Origin Issues: States her father: Bipolar, paternal uncle completed suicide.   Progress in Program Toward Treatment Goals: Met  Progress (rationale):  [name] reported, I've seen an improvement with a 3/10 depression with 10 being the highest, no suicidal ideations.  Overall, I'm good.  5/10 anxiety as I'm a little anxious going back to work.  No panic attacks recently.  Sleep is good, appetite is normal for me.  No side effects from her medications.  Denies hallucinations, paranoia, and homicidal ideations.  Collaboration of Care: Medication Management AEB hydroxyzine  25 mg at bedtime, may repeat once in an hour if not effective and Zoloft  100 mg daily.  Patient/Guardian was advised Release of Information must be obtained prior to any record release in order to collaborate their care with an outside provider. Patient/Guardian was advised if they have not already done so to contact the registration department to sign all necessary forms in order for us  to release information regarding their care.   Consent: Patient/Guardian gives verbal consent for treatment and assignment of benefits for services provided during this visit. Patient/Guardian expressed understanding and agreed to proceed.   Sharlot Becker, NP 07/08/2024

## 2024-07-08 NOTE — Progress Notes (Signed)
 Virtual Visit via Video Note  I connected with Kelsey Rojas on @TODAY @ at  9:00 AM EDT by a video enabled telemedicine application and verified that I am speaking with the correct person using two identifiers.  Location: Patient: at home Provider: at home office   I discussed the limitations of evaluation and management by telemedicine and the availability of in person appointments. The patient expressed understanding and agreed to proceed.  I discussed the assessment and treatment plan with the patient. The patient was provided an opportunity to ask questions and all were answered. The patient agreed with the plan and demonstrated an understanding of the instructions.   The patient was advised to call back or seek an in-person evaluation if the symptoms worsen or if the condition fails to improve as anticipated.  I provided 20 minutes of non-face-to-face time during this encounter.   Kelsey Rojas, [name],[name]   Patient ID: Kelsey Rojas, female   DOB: [birth year], 28 y.o.   MRN: [id number] D:  As per recent CCA states:  Stressors: grief (lost a grandmother and went through breakup after being together for about 16 months), work (possibility of being laid off),     ADLs: decreased     Psych meds: denies     Tx Hx: previous IOP, previous outpt therapy but denies current     Hospitalizations: denies     Attempts: denies     Dx: anxiety, depression     SI/HI/AVH: denies all     Self-harm: denies     Family Hx: aunt had bipolar disorder, depression on dad's side (uncle died by suicide in 07-20-00)     Supports: mother, father, brother, cousin     Living situation: alone     Current/most recent substance use: denies     Substance use history: denies     Medical diagnoses: recent hernia repair, gastric sleeve (Roux en y), migraines     Weapons in the home: denies.  Pt completed virtual MH-IOP today.  Pt has attended all 15 MH-IOP days.  Pt reports she feels a little better.  States she continues to struggle  with anxiety.  On a scale of 1-10 (10 being the worst); pt rates her anxiety at a 4 and depression at a 2.  Denies SI/HI or A/V hallucinations.  The groups were helpful, but the program isn't long enough.  A:  D/C pt today.  F/U with Dr. Carvin on 09-06-24 @ 2pm (in person).  Pt was provided with a list of therapists to call to obtain an appt.  Mentioned to pt, she could see if her employer provides EAP. Strongly encouraged [employer] for groups.  RTW on 07-11-24; without any restrictions. Pt was advised of ROI must be obtained prior to any records release in order to collaborate her care with an outside provider.  Pt was advised if she has not already done so to contact the front desk to sign all necessary forms in order for MH-IOP to release info re: her care.  Consent:  Pt gives verbal consent for tx and assignment of benefits for services provided during this telehealth group process.  Pt expressed understanding and agreed to proceed. Collaboration of care:  Collaborate with Staci Kerns, NP AEB, Darleene Ricker, LCSW AEB, and Dr. Carvin HAIR.   R:  Pt receptive.     Rojas Kelsey, [name],[name]

## 2024-07-14 ENCOUNTER — Ambulatory Visit: Admitting: Family Medicine

## 2024-07-14 ENCOUNTER — Encounter: Payer: Self-pay | Admitting: Family Medicine

## 2024-07-14 VITALS — BP 104/85 | HR 73 | Ht 62.0 in | Wt 254.1 lb

## 2024-07-14 DIAGNOSIS — Z9884 Bariatric surgery status: Secondary | ICD-10-CM | POA: Diagnosis not present

## 2024-07-14 DIAGNOSIS — Z01419 Encounter for gynecological examination (general) (routine) without abnormal findings: Secondary | ICD-10-CM | POA: Diagnosis not present

## 2024-07-14 DIAGNOSIS — E282 Polycystic ovarian syndrome: Secondary | ICD-10-CM | POA: Diagnosis not present

## 2024-07-14 MED ORDER — MEDROXYPROGESTERONE ACETATE 10 MG PO TABS: 10.0000 mg | ORAL_TABLET | Freq: Every day | ORAL | 3 refills | Status: DC

## 2024-07-14 NOTE — Progress Notes (Signed)
 ANNUAL EXAM Patient name: Kelsey Rojas MRN [id number]  Date of birth: 1996/06/06 Chief Complaint:   Annual Exam  History of Present Illness:   Kelsey Rojas is a 28 y.o.  G0P0000  female  being seen today for a routine annual exam.  Current complaints: Has always had irregular periods, going several months and even a year between menses. Not interested in becoming pregnant at this point. Has hirsutism, currently shaves. Was on OCPs at some point, but stopped them when they ran out. Had Roux-en-y bypass.   Patient's last menstrual period was 04/05/2024 (exact date).    Last pap due. Results were: always normal per patient.      06/14/2024    1:30 PM 01/28/2022    9:35 AM  Depression screen PHQ 2/9  Decreased Interest    Down, Depressed, Hopeless    PHQ - 2 Score    Altered sleeping    Tired, decreased energy    Change in appetite    Feeling bad or failure about yourself     Trouble concentrating    Moving slowly or fidgety/restless    Suicidal thoughts    PHQ-9 Score    Difficult doing work/chores       Information is confidential and restricted. Go to Review Flowsheets to unlock data.        06/14/2024    1:33 PM  GAD 7 : Generalized Anxiety Score  Nervous, Anxious, on Edge   Control/stop worrying   Worry too much - different things   Trouble relaxing   Restless   Easily annoyed or irritable   Afraid - awful might happen   Total GAD 7 Score   Anxiety Difficulty      Information is confidential and restricted. Go to Review Flowsheets to unlock data.     Review of Systems:   Pertinent items are noted in HPI Denies any headaches, blurred vision, fatigue, shortness of breath, chest pain, abdominal pain, abnormal vaginal discharge/itching/odor/irritation, problems with periods, bowel movements, urination, or intercourse unless otherwise stated above. Pertinent History Reviewed:  Reviewed past medical,surgical, social and family history.  Reviewed problem list,  medications and allergies. Physical Assessment:   Vitals:   07/14/24 1450  BP: 104/85  Pulse: 73  Weight: 254 lb 1.9 oz (115.3 kg)  Height: 5' 2 (1.575 m)  Body mass index is 46.48 kg/m.        Physical Examination:   General appearance - well appearing, and in no distress  Mental status - alert, oriented to person, place, and time  Psych:  She has a normal mood and affect  Skin - warm and dry, normal color, no suspicious lesions noted  Chest - effort normal, all lung fields clear to auscultation bilaterally  Heart - normal rate and regular rhythm  Neck:  midline trachea, no thyromegaly or nodules  Breasts - breasts appear normal, no suspicious masses, no skin or nipple changes or axillary nodes  Abdomen - soft, nontender, nondistended, no masses or organomegaly  Pelvic - VULVA: normal appearing vulva with no masses, tenderness or lesions  VAGINA: normal appearing vagina with normal color and discharge, no lesions  CERVIX: normal appearing cervix without discharge or lesions, no CMT  Thin prep pap is done with HR HPV cotesting    Extremities:  No swelling or varicosities noted  Chaperone present for exam  Assessment & Plan:  1. Well woman exam with routine gynecological exam (Primary) - Cytology - PAP - RPR+HBsAg+HCVAb+... - Cervicovaginal ancillary only(  [place])  2. PCOS (polycystic ovarian syndrome) Discussed options for endometrial protection - COC, POP, IUD, cyclical progesterone. Patient desires cyclical progesterone. Roux-en-y bypass should help with weight loss, which should help with fertility and cycle control  3. History of Roux-en-Y gastric bypass She currently has follow up with [facility] with bariatric surgery   Labs/procedures today:   Orders Placed This Encounter  Procedures   RPR+HBsAg+HCVAb+...    Meds:  Meds ordered this encounter  Medications   medroxyPROGESTERone  (PROVERA ) 10 MG tablet    Sig: Take 1 tablet (10 mg total) by mouth daily. Take  for first 10 days of every month    Dispense:  30 tablet    Refill:  3    Follow-up: No follow-ups on file.  Addalyn Speedy J Wandra Babin, DO 07/14/2024 4:13 PM

## 2024-07-15 ENCOUNTER — Other Ambulatory Visit (HOSPITAL_COMMUNITY)
Admission: RE | Admit: 2024-07-15 | Discharge: 2024-07-15 | Disposition: A | Discharge status: Home / Self Care | Source: Ambulatory Visit | Attending: Family Medicine | Admitting: Family Medicine

## 2024-07-15 DIAGNOSIS — Z9884 Bariatric surgery status: Secondary | ICD-10-CM | POA: Diagnosis present

## 2024-07-15 DIAGNOSIS — E282 Polycystic ovarian syndrome: Secondary | ICD-10-CM | POA: Diagnosis present

## 2024-07-15 DIAGNOSIS — Z01419 Encounter for gynecological examination (general) (routine) without abnormal findings: Secondary | ICD-10-CM | POA: Insufficient documentation

## 2024-07-19 ENCOUNTER — Encounter: Payer: Self-pay | Admitting: Family Medicine

## 2024-07-19 ENCOUNTER — Telehealth: Admitting: Diagnostic Neuroimaging

## 2024-07-19 LAB — CYTOLOGY - PAP
Comment: NEGATIVE
Diagnosis: NEGATIVE
High risk HPV: NEGATIVE

## 2024-07-21 ENCOUNTER — Ambulatory Visit: Payer: Self-pay | Admitting: Family Medicine

## 2024-07-29 ENCOUNTER — Other Ambulatory Visit (HOSPITAL_COMMUNITY): Payer: Self-pay | Admitting: Family

## 2024-08-25 ENCOUNTER — Other Ambulatory Visit (HOSPITAL_COMMUNITY): Payer: Self-pay | Admitting: Family

## 2024-09-03 ENCOUNTER — Encounter (HOSPITAL_BASED_OUTPATIENT_CLINIC_OR_DEPARTMENT_OTHER): Payer: Self-pay

## 2024-09-03 DIAGNOSIS — R051 Acute cough: Secondary | ICD-10-CM | POA: Insufficient documentation

## 2024-09-03 NOTE — ED Triage Notes (Signed)
 Pt reports cough and sinus issues since Monday with runny nose and reports tmax of 99. Has sick contact.

## 2024-09-04 ENCOUNTER — Emergency Department (HOSPITAL_BASED_OUTPATIENT_CLINIC_OR_DEPARTMENT_OTHER)
Admission: EM | Admit: 2024-09-04 | Discharge: 2024-09-04 | Disposition: A | Payer: Self-pay | Attending: Emergency Medicine | Admitting: Emergency Medicine

## 2024-09-04 ENCOUNTER — Emergency Department (HOSPITAL_BASED_OUTPATIENT_CLINIC_OR_DEPARTMENT_OTHER): Payer: Self-pay

## 2024-09-04 DIAGNOSIS — R051 Acute cough: Secondary | ICD-10-CM

## 2024-09-04 LAB — RESP PANEL BY RT-PCR (RSV, FLU A&B, COVID)  RVPGX2
Influenza A by PCR: NEGATIVE
Influenza B by PCR: NEGATIVE
Resp Syncytial Virus by PCR: NEGATIVE
SARS Coronavirus 2 by RT PCR: NEGATIVE

## 2024-09-04 MED ORDER — HYDROCODONE BIT-HOMATROP MBR 5-1.5 MG/5ML PO SOLN: 5.0000 mL | Freq: Four times a day (QID) | ORAL | 0 refills | Status: AC | PRN

## 2024-09-04 NOTE — ED Provider Notes (Signed)
 [place] [facility] Provider Note   CSN: [id number] Arrival date & time: 09/03/24  2325     Patient presents with: Cough   Kelsey Rojas is a 28 y.o. female.   The patient presents with a cough that began approximately one week ago, with productive yellow sputum appearing two days ago, primarily in the mornings, and clear sputum at other times. The patient reports a mild fever of 99.29F on Wednesday. There is no history of smoking, sinus pain, or ear pain, although the patient describes itchy ears and a sensation of swollen lymph nodes. The patient had exposure to a sick contact the Saturday before symptom onset. The history was obtained from the patient.   Cough      Prior to Admission medications   Medication Sig Start Date End Date Taking? Authorizing Provider  HYDROcodone  bit-homatropine (HYCODAN) 5-1.5 MG/5ML syrup Take 5 mLs by mouth every 6 (six) hours as needed for cough. 09/04/24  Yes Zilphia Kozinski, Selinda, MD  hydrOXYzine  (VISTARIL ) 25 MG capsule Take 1 capsule (25 mg total) by mouth at bedtime and may repeat dose one time if needed. Patient not taking: Reported on 07/14/2024 07/05/24   Ezzard Staci SAILOR, NP  medroxyPROGESTERone  (PROVERA ) 10 MG tablet Take 1 tablet (10 mg total) by mouth daily. Take for first 10 days of every month 07/14/24   Stinson, Jacob J, DO  ondansetron  (ZOFRAN -ODT) 4 MG disintegrating tablet Take 1 tablet (4 mg total) by mouth every 8 (eight) hours as needed. 12/23/23   Long, Fonda MATSU, MD  rizatriptan  (MAXALT -MLT) 10 MG disintegrating tablet Take 1 tablet (10 mg total) by mouth as needed for migraine. May repeat in 2 hours if needed 05/27/24   Penumalli, Eduard SAUNDERS, MD  sertraline  (ZOLOFT ) 100 MG tablet Take 1 tablet (100 mg total) by mouth daily. 07/01/24 07/01/25  Ezzard Staci SAILOR, NP    Allergies: Penicillins, Ciprofloxacin, Amoxicillin , and Qulipta [atogepant]    Review of Systems  Respiratory:  Positive for cough.     Updated  Vital Signs BP (!) 140/96 (BP Location: Left Arm)   Pulse 76   Temp 98.6 F (37 C) (Oral)   Resp 16   Ht 5' 2 (1.575 m)   Wt 112.5 kg   LMP 08/27/2024 (Exact Date)   SpO2 100%   BMI 45.36 kg/m   Physical Exam Vitals and nursing note reviewed.  Constitutional:      Appearance: She is well-developed.  HENT:     Head: Normocephalic and atraumatic.     Nose: Congestion present. No rhinorrhea.  Cardiovascular:     Rate and Rhythm: Normal rate and regular rhythm.  Pulmonary:     Effort: No respiratory distress.     Breath sounds: No stridor.  Abdominal:     General: There is no distension.  Musculoskeletal:        General: No swelling or tenderness. Normal range of motion.     Cervical back: Normal range of motion.  Skin:    General: Skin is warm and dry.  Neurological:     General: No focal deficit present.     Mental Status: She is alert.     (all labs ordered are listed, but only abnormal results are displayed) Labs Reviewed  RESP PANEL BY RT-PCR (RSV, FLU A&B, COVID)  [id number]    EKG: None  Radiology: DG Chest 2 View Result Date: 09/04/2024 EXAM: 2 VIEW(S) XRAY OF THE CHEST 09/04/2024 02:34:00 AM COMPARISON: Chest x-ray 01/18/2024.  CLINICAL HISTORY: eval for pneumonia. Cough w/ congestion and chest discomfort x 6 days. FINDINGS: LUNGS AND PLEURA: No focal pulmonary opacity. No pulmonary edema. No pleural effusion. No pneumothorax. HEART AND MEDIASTINUM: No acute abnormality of the cardiac and mediastinal silhouettes. BONES AND SOFT TISSUES: No acute osseous abnormality. IMPRESSION: 1. No acute cardiopulmonary process. Electronically signed by: Morgane Naveau MD 09/04/2024 02:59 AM EDT RP Workstation: [id number]     Procedures   Medications Ordered in the ED - No data to display                                  Medical Decision Making Amount and/or Complexity of Data Reviewed Radiology: ordered.  Risk Prescription drug management.   Likely viral with  prolonged cough. No pneumonia. No red flags for bacterial infection. Will continue symptomatic treatment at home.    Final diagnoses:  Acute cough    ED Discharge Orders          Ordered    HYDROcodone  bit-homatropine (HYCODAN) 5-1.5 MG/5ML syrup  Every 6 hours PRN        09/04/24 0411               Zachery Niswander, Selinda, MD 09/04/24 [phone]

## 2024-09-05 NOTE — Progress Notes (Unsigned)
 Psychiatric Initial Adult Assessment   Patient Identification: Kelsey Rojas MRN:  [id number] Date of Evaluation:  09/05/2024 Referral Source: IOP Chief Complaint:  No chief complaint on file.  Visit Diagnosis: No diagnosis found.   Assessment:  Kelsey Rojas is a 28 y.o. female with a history of *** who presents in person to [facility] for initial evaluation on 09/05/2024.    At initial evaluation patient reports ***  A number of assessments were performed during the evaluation today including  PHQ-9 which they scored a *** on, GAD-7 which they scored a *** on, and [place] suicide severity screening which showed ***.    Risk Assessment: A suicide and violence risk assessment was performed as part of this evaluation. There patient is deemed to be at chronic elevated risk for self-harm/suicide given the following factors: {SABSUICIDERISKFACTORS:29780}. These risk factors are mitigated by the following factors: {SABSUICIDEPROTECTIVEFACTORS:29779}. The patient is deemed to be at chronic elevated risk for violence given the following factors: {SABVIOLENCERISKFACTORS:29781}. These risk factors are mitigated by the following factors: {SABVIOLENCEPROTECTIVEFACTORS:29782}. There is no *** acute risk for suicide or violence at this time. The patient was educated about relevant modifiable risk factors including following recommendations for treatment of psychiatric illness and abstaining from substance abuse.  While future psychiatric events cannot be accurately predicted, the patient does not *** currently require  acute inpatient psychiatric care and does not *** currently meet   involuntary commitment criteria.  Patient was given contact information for crisis resources, behavioral health clinic and was instructed to call 911 for emergencies.    Plan: # *** Past medication trials:  Status of problem: *** Interventions: -- ***  # *** Past  medication trials:  Status of problem: *** Interventions: -- ***  # *** Past medication trials:  Status of problem: *** Interventions: -- ***   History of Present Illness:  ***  Past Psychiatric History:  Past psychiatric diagnoses: *** Psychiatric hospitalizations:*** Past suicide attempts: *** Hx of self harm: *** Hx of violence towards others: *** Prior psychiatric providers: *** Prior therapy: *** Access to firearms: ***  Prior medication trials: ***  Substance use: ***  Past Medical History:  Past Medical History:  Diagnosis Date   GERD (gastroesophageal reflux disease)    Macromastia 09/2018   Migraines    Morbid obesity (HCC)    OSA on CPAP     Past Surgical History:  Procedure Laterality Date   BREAST REDUCTION SURGERY Bilateral 10/12/2018   Procedure: BILATERAL MAMMARY REDUCTION  (BREAST);  Surgeon: Contogiannis, Ronal Caldron, MD;  Location: [facility] OR;  Service: Plastics;  Laterality: Bilateral;  BILATERAL MAMMARY REDUCTION  (BREAST)   CHOLECYSTECTOMY, LAPAROSCOPIC  04/10/2022   UPPER GASTROINTESTINAL ENDOSCOPY      Family Psychiatric History: ***  Family History:  Family History  Problem Relation Age of Onset   Alcohol abuse Paternal Grandfather    Migraines Paternal Grandmother    Cancer Father    Hypertension Father    Depression Mother    Anxiety disorder Mother    Hypertension Mother    Migraines Mother     Social History:   Social History   Socioeconomic History   Marital status: Single    Spouse name: Not on file   Number of children: 0   Years of education: Not on file   Highest education level: Bachelor's degree (e.g., BA, AB, BS)  Occupational History   Not on file  Tobacco Use   Smoking status: Never  Smokeless tobacco: Never  Vaping Use   Vaping status: Never Used  Substance and Sexual Activity   Alcohol use: Not Currently    Alcohol/week: 1.0 standard drink of alcohol    Types: 1 Glasses of wine per week    Comment:  occasionally   Drug use: No   Sexual activity: Yes    Birth control/protection: None  Other Topics Concern   Not on file  Social History Narrative   Pt lives alone    Pt works    Social Drivers of Corporate investment banker Strain: Medium Risk (03/23/2024)   Received from [facility]   Overall Financial Resource Strain (CARDIA)    Difficulty of Paying Living Expenses: Somewhat hard  Food Insecurity: No Food Insecurity (03/23/2024)   Received from [facility]   Hunger Vital Sign    Within the past 12 months, you worried that your food would run out before you got the money to buy more.: Never true    Within the past 12 months, the food you bought just didn't last and you didn't have money to get more.: Never true  Transportation Needs: No Transportation Needs (03/23/2024)   Received from [facility] - Transportation    Lack of Transportation (Medical): No    Lack of Transportation (Non-Medical): No  Physical Activity: Insufficiently Active (03/23/2024)   Received from [facility]   Exercise Vital Sign    On average, how many days per week do you engage in moderate to strenuous exercise (like a brisk walk)?: 3 days    On average, how many minutes do you engage in exercise at this level?: 30 min  Stress: No Stress Concern Present (03/23/2024)   Received from [facility] - Occupational Stress Questionnaire    Feeling of Stress : Only a little  Social Connections: Moderately Integrated (03/23/2024)   Received from [facility]   Social Network    How would you rate your social network (family, work, friends)?: Adequate participation with social networks    Additional Social History: ***  Allergies:   Allergies  Allergen Reactions   Penicillins Rash and Hives    Has patient had a PCN reaction causing immediate rash, facial/tongue/throat swelling, SOB or lightheadedness with hypotension: No Has patient had a PCN reaction  causing severe rash involving mucus membranes or skin necrosis: No Has patient had a PCN reaction that required hospitalization: No Has patient had a PCN reaction occurring within the last 10 years: #  #  #  YES  #  #  #  If all of the above answers are NO, then may proceed with Cephalosporin use.  Has patient had a PCN reaction causing immediate rash, facial/tongue/throat swelling, SOB or lightheadedness with hypotension: No Has patient had a PCN reaction causing severe rash involving mucus membranes or skin necrosis: No Has patient had a PCN reaction that required hospitalization: No Has patient had a PCN reaction occurring within the last 10 years: #  #  #  YES  #  #  #  If all of the above answers are NO, then may proceed with Cephalosporin use.   Ciprofloxacin Hives    Family history of a reaction UNSPECIFIED FAMILY REACTION Family history of a reaction UNSPECIFIED FAMILY REACTION   Amoxicillin  Rash   Qulipta [Atogepant] Hives and Rash    Metabolic Disorder Labs: No results found for: HGBA1C, MPG No results found for: PROLACTIN No results  found for: CHOL, TRIG, HDL, CHOLHDL, VLDL, LDLCALC No results found for: TSH  Therapeutic Level Labs: No results found for: LITHIUM No results found for: CBMZ No results found for: VALPROATE  Current Medications: Current Outpatient Medications  Medication Sig Dispense Refill   HYDROcodone  bit-homatropine (HYCODAN) 5-1.5 MG/5ML syrup Take 5 mLs by mouth every 6 (six) hours as needed for cough. 120 mL 0   hydrOXYzine  (VISTARIL ) 25 MG capsule Take 1 capsule (25 mg total) by mouth at bedtime and may repeat dose one time if needed. (Patient not taking: Reported on 07/14/2024) 30 capsule 0   medroxyPROGESTERone  (PROVERA ) 10 MG tablet Take 1 tablet (10 mg total) by mouth daily. Take for first 10 days of every month 30 tablet 3   ondansetron  (ZOFRAN -ODT) 4 MG disintegrating tablet Take 1 tablet (4 mg total) by mouth  every 8 (eight) hours as needed. 20 tablet 0   rizatriptan  (MAXALT -MLT) 10 MG disintegrating tablet Take 1 tablet (10 mg total) by mouth as needed for migraine. May repeat in 2 hours if needed 9 tablet 11   sertraline  (ZOLOFT ) 100 MG tablet Take 1 tablet (100 mg total) by mouth daily. 60 tablet 0   No current facility-administered medications for this visit.    Musculoskeletal: Strength & Muscle Tone: {desc; muscle tone:32375} Gait & Station: {PE GAIT ED WJUO:77474} Patient leans: {Patient Leans:21022755}  Psychiatric Specialty Exam:  Psychiatric Specialty Exam: Last menstrual period 08/27/2024.There is no height or weight on file to calculate BMI. Review of Systems  General Appearance: {Appearance:22683}  Eye Contact:  {BHH EYE CONTACT:22684}  Speech:  {Speech:22685}  Volume:  {Volume (PAA):22686}  Mood:  {BHH MOOD:22306}  Affect:  {Affect (PAA):22687}  Thought Content: {Thought Content:22690}   Suicidal Thoughts:  {ST/HT (PAA):22692}  Homicidal Thoughts:  {ST/HT (PAA):22692}  Thought Process:  {Thought Process (PAA):22688}  Orientation:  {BHH ORIENTATION (PAA):22689}    Memory: {BHH MEMORY:22881}  Judgment:  {Judgement (PAA):22694}  Insight:  {Insight (PAA):22695}  Concentration:  {Concentration:21399}  Recall:  not formally assessed ***  Fund of Knowledge: {BHH GOOD/FAIR/POOR:22877}  Language: {BHH GOOD/FAIR/POOR:22877}  Psychomotor Activity:  {Psychomotor (PAA):22696}  Akathisia:  {BHH YES OR NO:22294}  AIMS (if indicated): {Desc; done/not:10129}  Assets:  {Assets (PAA):22698}  ADL's:  {BHH JIO'D:77709}  Cognition: {chl bhh cognition:304700322}  Sleep:  {BHH GOOD/FAIR/POOR:22877}    Screenings: GAD-7    Flowsheet Row Counselor from 06/14/2024 in BEHAVIORAL HEALTH INTENSIVE PSYCH  Total GAD-7 Score 14   PHQ2-9    Flowsheet Row Counselor from 06/14/2024 in BEHAVIORAL HEALTH INTENSIVE PSYCH Counselor from 01/28/2022 in BEHAVIORAL HEALTH INTENSIVE PSYCH  PHQ-2 Total  Score 4 6  PHQ-9 Total Score 17 19   Flowsheet Row ED from 09/04/2024 in [facility] Department at [facility] ED from 12/23/2023 in [facility] Emergency Department at [facility] ED from 06/19/2023 in [facility] Emergency Department at [facility]  C-SSRS RISK CATEGORY No Risk No Risk No Risk     Collaboration of Care: {BH OP Collaboration of Care:21014065}  Patient/Guardian was advised Release of Information must be obtained prior to any record release in order to collaborate their care with an outside provider. Patient/Guardian was advised if they have not already done so to contact the registration department to sign all necessary forms in order for us  to release information regarding their care.   Consent: Patient/Guardian gives verbal consent for treatment and assignment of benefits for services provided during this visit. Patient/Guardian expressed understanding and agreed to proceed.  Arvella CHRISTELLA Finder, MD 10/20/20259:26 AM

## 2024-09-06 ENCOUNTER — Encounter (HOSPITAL_COMMUNITY): Admitting: Psychiatry

## 2024-09-07 NOTE — Progress Notes (Signed)
 This encounter was created in error - please disregard. Patient cancelled and rescheduled appointment the day prior.

## 2024-09-28 ENCOUNTER — Emergency Department (HOSPITAL_BASED_OUTPATIENT_CLINIC_OR_DEPARTMENT_OTHER): Admission: EM | Admit: 2024-09-28 | Discharge: 2024-09-28 | Disposition: A | Discharge status: Home / Self Care

## 2024-09-28 ENCOUNTER — Encounter (HOSPITAL_BASED_OUTPATIENT_CLINIC_OR_DEPARTMENT_OTHER): Payer: Self-pay

## 2024-09-28 ENCOUNTER — Other Ambulatory Visit: Payer: Self-pay

## 2024-09-28 DIAGNOSIS — R112 Nausea with vomiting, unspecified: Secondary | ICD-10-CM | POA: Insufficient documentation

## 2024-09-28 DIAGNOSIS — R109 Unspecified abdominal pain: Secondary | ICD-10-CM | POA: Insufficient documentation

## 2024-09-28 LAB — WET PREP, GENITAL
Sperm: NONE SEEN
Trich, Wet Prep: NONE SEEN
WBC, Wet Prep HPF POC: <10 (ref <10)
Yeast Wet Prep HPF POC: NONE SEEN

## 2024-09-28 LAB — URINALYSIS, ROUTINE W REFLEX MICROSCOPIC
Bilirubin Urine: NEGATIVE
Glucose, UA: NEGATIVE mg/dL
Hgb urine dipstick: NEGATIVE
Ketones, ur: NEGATIVE mg/dL
Leukocytes,Ua: NEGATIVE
Nitrite: NEGATIVE
Protein, ur: NEGATIVE mg/dL
Specific Gravity, Urine: 1.02 (ref 1.005–1.030)
pH: 7 (ref 5.0–8.0)

## 2024-09-28 LAB — COMPREHENSIVE METABOLIC PANEL WITH GFR
ALT: 12 U/L (ref 0–44)
AST: 20 U/L (ref 15–41)
Albumin: 4.2 g/dL (ref 3.5–5.0)
Alkaline Phosphatase: 106 U/L (ref 38–126)
Anion gap: 10 (ref 5–15)
BUN: 9 mg/dL (ref 6–20)
CO2: 24 mmol/L (ref 22–32)
Calcium: 8.7 mg/dL — ABNORMAL LOW (ref 8.9–10.3)
Chloride: 105 mmol/L (ref 98–111)
Creatinine, Ser: 0.64 mg/dL (ref 0.44–1.00)
GFR, Estimated: >60 mL/min (ref >60)
Glucose, Bld: 89 mg/dL (ref 70–99)
Potassium: 3.9 mmol/L (ref 3.5–5.1)
Sodium: 138 mmol/L (ref 135–145)
Total Bilirubin: 0.3 mg/dL (ref 0.0–1.2)
Total Protein: 7 g/dL (ref 6.5–8.1)

## 2024-09-28 LAB — LIPASE, BLOOD: Lipase: 74 U/L — ABNORMAL HIGH (ref 11–51)

## 2024-09-28 LAB — CBC
HCT: 38.5 % (ref 36.0–46.0)
Hemoglobin: 12.1 g/dL (ref 12.0–15.0)
MCH: 26.9 pg (ref 26.0–34.0)
MCHC: 31.4 g/dL (ref 30.0–36.0)
MCV: 85.6 fL (ref 80.0–100.0)
Platelets: 209 K/uL (ref 150–400)
RBC: 4.5 MIL/uL (ref 3.87–5.11)
RDW: 13.2 % (ref 11.5–15.5)
WBC: 6.6 K/uL (ref 4.0–10.5)
nRBC: 0 % (ref 0.0–0.2)

## 2024-09-28 LAB — PREGNANCY, URINE: Preg Test, Ur: NEGATIVE

## 2024-09-28 MED ORDER — DOXYCYCLINE HYCLATE 100 MG PO CAPS: 100.0000 mg | ORAL_CAPSULE | Freq: Two times a day (BID) | ORAL | 0 refills | Status: AC

## 2024-09-28 MED ORDER — ONDANSETRON 4 MG PO TBDP
4.0000 mg | ORAL_TABLET | Freq: Once | ORAL | Status: AC
  Administered 2024-09-28: 4 mg via ORAL
  Filled 2024-09-28: qty 1

## 2024-09-28 MED ORDER — ONDANSETRON 4 MG PO TBDP: 4.0000 mg | ORAL_TABLET | Freq: Three times a day (TID) | ORAL | 0 refills | Status: AC | PRN

## 2024-09-28 NOTE — Discharge Instructions (Addendum)
 Take doxycycline for the entire course even if you are asymptomatic throughout.  This will treat your chlamydia.  Follow-up with the health department for retesting/test of cure if you would like this.  Zofran  you can take every 6-8 hours as needed for nausea.  Return to the emergency room for any new or concerning symptoms.

## 2024-09-28 NOTE — ED Notes (Signed)
 Pt transferred from WR to ED RM 7. Assuming pt care at this time.

## 2024-09-28 NOTE — ED Triage Notes (Signed)
 L sided and R sided abd, N/V/D since Saturday.   Also wants to make sure BV and chlamydia has cleared up from positive testing in September. Denies urinary symptoms.   Had bariatric surgery in April

## 2024-09-28 NOTE — ED Provider Notes (Signed)
 [facility] Provider Note   CSN: [id number] Arrival date & time: 09/28/24  1753     Patient presents with: Abdominal Pain   Kelsey Rojas is a 28 y.o. female.    Abdominal Pain  Patient is a 28 year old female with past medical history significant for Roux-en-Y gastric bypass, reflux, migraines, morbid obesity, OSA on CPAP.  Also has a history of cholecystectomy  She presents emergency room today with complaints of untreated chlamydia states that she is asymptomatic but was seen at urgent care and was prescribed Flagyl for BV but was told that this would cover her chlamydia.  She also states that she wanted to make sure that her BV has cleared up.  She denies any urinary frequency urgency dysuria hematuria.  She also indicates that she has had some nausea vomiting abdominal pain over the past 5 days.  Seems that she has had 1-4 bowel movements per day no other more watery but rather loose she describes like a cow patty no blood in her stool.  She denies any fevers, states she has no abdominal pain currently but is had some crampy abdominal pain earlier today.  Has had 1 or 2 episodes of nonbloody, nonbilious emesis over the past week     Prior to Admission medications   Medication Sig Start Date End Date Taking? Authorizing Provider  doxycycline (VIBRAMYCIN) 100 MG capsule Take 1 capsule (100 mg total) by mouth 2 (two) times daily for 7 days. 09/28/24 10/05/24 Yes Fadel Clason S, PA  ondansetron  (ZOFRAN -ODT) 4 MG disintegrating tablet Take 1 tablet (4 mg total) by mouth every 8 (eight) hours as needed for nausea or vomiting. 09/28/24  Yes Cleave Ternes S, PA  HYDROcodone  bit-homatropine (HYCODAN) 5-1.5 MG/5ML syrup Take 5 mLs by mouth every 6 (six) hours as needed for cough. 09/04/24   Mesner, Selinda, MD  hydrOXYzine  (VISTARIL ) 25 MG capsule Take 1 capsule (25 mg total) by mouth at bedtime and may repeat dose one time if needed. Patient  not taking: Reported on 07/14/2024 07/05/24   Ezzard Staci SAILOR, NP  medroxyPROGESTERone  (PROVERA ) 10 MG tablet Take 1 tablet (10 mg total) by mouth daily. Take for first 10 days of every month 07/14/24   Stinson, Jacob J, DO  rizatriptan  (MAXALT -MLT) 10 MG disintegrating tablet Take 1 tablet (10 mg total) by mouth as needed for migraine. May repeat in 2 hours if needed 05/27/24   Penumalli, Eduard SAUNDERS, MD  sertraline  (ZOLOFT ) 100 MG tablet Take 1 tablet (100 mg total) by mouth daily. 07/01/24 07/01/25  Ezzard Staci SAILOR, NP    Allergies: Penicillins, Ciprofloxacin, Amoxicillin , and Qulipta [atogepant]    Review of Systems  Gastrointestinal:  Positive for abdominal pain.    Updated Vital Signs BP (!) 149/86 (BP Location: Right Arm)   Pulse 81   Temp 97.8 F (36.6 C)   Resp 18   Ht 5' 2 (1.575 m)   Wt 111.1 kg   LMP 08/27/2024 (Exact Date)   SpO2 100%   BMI 44.80 kg/m   Physical Exam Vitals and nursing note reviewed.  Constitutional:      General: She is not in acute distress. HENT:     Head: Normocephalic and atraumatic.     Nose: Nose normal.  Eyes:     General: No scleral icterus. Cardiovascular:     Rate and Rhythm: Normal rate and regular rhythm.     Pulses: Normal pulses.     Heart sounds: Normal  heart sounds.  Pulmonary:     Effort: Pulmonary effort is normal. No respiratory distress.     Breath sounds: No wheezing.  Abdominal:     Palpations: Abdomen is soft.     Tenderness: There is no abdominal tenderness.     Comments: Soft, obese abdomen.  No guarding or rebound no tenderness  Musculoskeletal:     Cervical back: Normal range of motion.     Right lower leg: No edema.     Left lower leg: No edema.  Skin:    General: Skin is warm and dry.     Capillary Refill: Capillary refill takes less than 2 seconds.  Neurological:     Mental Status: She is alert. Mental status is at baseline.  Psychiatric:        Mood and Affect: Mood normal.        Behavior: Behavior normal.      (all labs ordered are listed, but only abnormal results are displayed) Labs Reviewed  WET PREP, GENITAL - Abnormal; Notable for the following components:      Result Value   Clue Cells Wet Prep HPF POC PRESENT (*)    All other components within normal limits  LIPASE, BLOOD - Abnormal; Notable for the following components:   Lipase 74 (*)    All other components within normal limits  COMPREHENSIVE METABOLIC PANEL WITH GFR - Abnormal; Notable for the following components:   Calcium 8.7 (*)    All other components within normal limits  CBC  URINALYSIS, ROUTINE W REFLEX MICROSCOPIC  PREGNANCY, URINE  GC/CHLAMYDIA PROBE AMP ([place]) NOT AT [facility]    EKG: None  Radiology: No results found.   Procedures   Medications Ordered in the ED  ondansetron  (ZOFRAN -ODT) disintegrating tablet 4 mg (4 mg Oral Given 09/28/24 2013)    Clinical Course as of 09/28/24 2017  Wed Sep 28, 2024  1943 Since the 1st she's vomited twice a day.  Some abd pain the last two-three days.   [WF]    Clinical Course User Index [WF] Neldon Hamp RAMAN, PA                                 Medical Decision Making Amount and/or Complexity of Data Reviewed Labs: ordered.  Risk Prescription drug management.   Patient is a 28 year old female with past medical history significant for Roux-en-Y gastric bypass, reflux, migraines, morbid obesity, OSA on CPAP.  Also has a history of cholecystectomy  She presents emergency room today with complaints of untreated chlamydia states that she is asymptomatic but was seen at urgent care and was prescribed Flagyl for BV but was told that this would cover her chlamydia.  She also states that she wanted to make sure that her BV has cleared up.  She denies any urinary frequency urgency dysuria hematuria.  She also indicates that she has had some nausea vomiting abdominal pain over the past 5 days.  Seems that she has had 1-4 bowel movements per day no other more  watery but rather loose she describes like a cow patty no blood in her stool.  She denies any fevers, states she has no abdominal pain currently but is had some crampy abdominal pain earlier today.  Has had 1 or 2 episodes of nonbloody, nonbilious emesis over the past week   The causes of generalized abdominal pain include but are not limited to AAA, mesenteric ischemia,  appendicitis, diverticulitis, DKA, gastritis, gastroenteritis, AMI, nephrolithiasis, pancreatitis, peritonitis, adrenal insufficiency,lead poisoning, iron toxicity, intestinal ischemia, constipation, UTI,SBO/LBO, splenic rupture, biliary disease, IBD, IBS, PUD, or hepatitis. Ectopic pregnancy, ovarian torsion, PID.    CMP and CBC unremarkable lipase marginally elevated no epigastric tenderness or pain.  Have low suspicion for pancreatitis.  Will have to have this rechecked with primary care.  Urine pregnancy negative urinalysis unremarkable no evidence of infection urine gonorrhea chlamydia probe obtained.  Will treat with doxycycline given recent positive test for chlamydia without treatment. Zofran  for home use for nausea as needed Follow-up with PCP.  Return to ER for any new or concerning symptoms.   Final diagnoses:  Nausea and vomiting, unspecified vomiting type    ED Discharge Orders          Ordered    doxycycline (VIBRAMYCIN) 100 MG capsule  2 times daily        09/28/24 1948    ondansetron  (ZOFRAN -ODT) 4 MG disintegrating tablet  Every 8 hours PRN        09/28/24 1948               Neldon Hamp RAMAN, PA 09/28/24 2241    Neysa Caron PARAS, DO 09/28/24 2325

## 2024-09-28 NOTE — ED Notes (Signed)
 Pt aware of necessity of urine sample.

## 2024-09-29 LAB — GC/CHLAMYDIA PROBE AMP (~~LOC~~) NOT AT ARMC
Chlamydia: POSITIVE — AB
Comment: NEGATIVE
Comment: NORMAL
Neisseria Gonorrhea: NEGATIVE

## 2024-09-30 ENCOUNTER — Ambulatory Visit (HOSPITAL_COMMUNITY): Payer: Self-pay

## 2024-10-11 ENCOUNTER — Ambulatory Visit (HOSPITAL_COMMUNITY): Admitting: Psychiatry

## 2024-10-15 ENCOUNTER — Other Ambulatory Visit: Payer: Self-pay | Admitting: Family Medicine

## 2024-10-19 ENCOUNTER — Other Ambulatory Visit: Payer: Self-pay

## 2024-11-09 ENCOUNTER — Encounter: Payer: Self-pay | Admitting: Family Medicine

## 2024-11-15 MED ORDER — MEDROXYPROGESTERONE ACETATE 10 MG PO TABS: 10.0000 mg | ORAL_TABLET | Freq: Every day | ORAL | 3 refills | Status: AC

## 2024-11-15 NOTE — Addendum Note (Signed)
 Addended by: BARBRA LANG PARAS on: 11/15/2024 03:43 PM   Modules accepted: Orders

## 2024-11-16 ENCOUNTER — Telehealth: Admitting: Physician Assistant

## 2024-11-16 DIAGNOSIS — R058 Other specified cough: Secondary | ICD-10-CM

## 2024-11-16 DIAGNOSIS — R0602 Shortness of breath: Secondary | ICD-10-CM

## 2024-11-16 DIAGNOSIS — R071 Chest pain on breathing: Secondary | ICD-10-CM

## 2024-11-16 NOTE — Progress Notes (Signed)
 " Virtual Visit Consent   Kelsey Rojas, you are scheduled for a virtual visit with a [facility] provider today. Just as with appointments in the office, your consent must be obtained to participate. Your consent will be active for this visit and any virtual visit you may have with one of our providers in the next 365 days. If you have a MyChart account, a copy of this consent can be sent to you electronically.  As this is a virtual visit, video technology does not allow for your provider to perform a traditional examination. This may limit your provider's ability to fully assess your condition. If your provider identifies any concerns that need to be evaluated in person or the need to arrange testing (such as labs, EKG, etc.), we will make arrangements to do so. Although advances in technology are sophisticated, we cannot ensure that it will always work on either your end or our end. If the connection with a video visit is poor, the visit may have to be switched to a telephone visit. With either a video or telephone visit, we are not always able to ensure that we have a secure connection.  By engaging in this virtual visit, you consent to the provision of healthcare and authorize for your insurance to be billed (if applicable) for the services provided during this visit. Depending on your insurance coverage, you may receive a charge related to this service.  I need to obtain your verbal consent now. Are you willing to proceed with your visit today? Kelsey Rojas has provided verbal consent on 11/16/2024 for a virtual visit (video or telephone). Delon CHRISTELLA Dickinson, PA-C  Date: 11/16/2024 12:40 PM   Virtual Visit via Video Note   IDelon CHRISTELLA Dickinson, connected with  Kelsey Rojas  ([id number], [birth year]) on 11/16/2024 at 12:30 PM EST by a video-enabled telemedicine application and verified that I am speaking with the correct person using two identifiers.  Location: Patient: Virtual Visit Location  Patient: Home Provider: Virtual Visit Location Provider: Home Office   I discussed the limitations of evaluation and management by telemedicine and the availability of in person appointments. The patient expressed understanding and agreed to proceed.    History of Present Illness: Kelsey Rojas is a 28 y.o. who identifies as a female who was assigned female at birth, and is being seen today for shortness of breath, cough, chest congestion.  HPI: URI  This is a new problem. The current episode started in the past 7 days (Started Sunday). The problem has been gradually worsening. Maximum temperature: subjective. Associated symptoms include chest pain, congestion and coughing (dry). Associated symptoms comments: Shortness of breath limiting activity and speech occasionally. The treatment provided no relief.     Problems:  Patient Active Problem List   Diagnosis Date Noted   PCOS (polycystic ovarian syndrome) 07/14/2024   History of Roux-en-Y gastric bypass 07/14/2024   MDD (major depressive disorder), recurrent episode, moderate (HCC) 06/14/2024    Allergies: Allergies[1] Medications: Current Medications[2]  Observations/Objective: Patient is well-developed, well-nourished in no acute distress.  Resting comfortably at home.  Head is normocephalic, atraumatic.  No labored breathing.  Speech is clear and coherent with logical content.  Patient is alert and oriented at baseline.    Assessment and Plan: 1. Shortness of breath (Primary)  2. Chest pain on breathing  3. Dry cough  - Patient with shortness of breath limiting activity and reportedly with speech, with no history of asthma in setting of an acute URI -  I have advised to seek in person evaluation to be evaluated for pneumonia  Follow Up Instructions: I discussed the assessment and treatment plan with the patient. The patient was provided an opportunity to ask questions and all were answered. The patient agreed with the plan  and demonstrated an understanding of the instructions.  A copy of instructions were sent to the patient via MyChart unless otherwise noted below.    The patient was advised to call back or seek an in-person evaluation if the symptoms worsen or if the condition fails to improve as anticipated.    Delon CHRISTELLA Dickinson, PA-C     [1]  Allergies Allergen Reactions   Penicillins Rash and Hives    Has patient had a PCN reaction causing immediate rash, facial/tongue/throat swelling, SOB or lightheadedness with hypotension: No Has patient had a PCN reaction causing severe rash involving mucus membranes or skin necrosis: No Has patient had a PCN reaction that required hospitalization: No Has patient had a PCN reaction occurring within the last 10 years: #  #  #  YES  #  #  #  If all of the above answers are NO, then may proceed with Cephalosporin use.  Has patient had a PCN reaction causing immediate rash, facial/tongue/throat swelling, SOB or lightheadedness with hypotension: No Has patient had a PCN reaction causing severe rash involving mucus membranes or skin necrosis: No Has patient had a PCN reaction that required hospitalization: No Has patient had a PCN reaction occurring within the last 10 years: #  #  #  YES  #  #  #  If all of the above answers are NO, then may proceed with Cephalosporin use.   Ciprofloxacin Hives    Family history of a reaction UNSPECIFIED FAMILY REACTION Family history of a reaction UNSPECIFIED FAMILY REACTION   Amoxicillin  Rash   Qulipta [Atogepant] Hives and Rash  [2]  Current Outpatient Medications:    HYDROcodone  bit-homatropine (HYCODAN) 5-1.5 MG/5ML syrup, Take 5 mLs by mouth every 6 (six) hours as needed for cough., Disp: 120 mL, Rfl: 0   hydrOXYzine  (VISTARIL ) 25 MG capsule, Take 1 capsule (25 mg total) by mouth at bedtime and may repeat dose one time if needed. (Patient not taking: Reported on 07/14/2024), Disp: 30 capsule, Rfl: 0    medroxyPROGESTERone  (PROVERA ) 10 MG tablet, Take 1 tablet (10 mg total) by mouth daily., Disp: 90 tablet, Rfl: 3   ondansetron  (ZOFRAN -ODT) 4 MG disintegrating tablet, Take 1 tablet (4 mg total) by mouth every 8 (eight) hours as needed for nausea or vomiting., Disp: 20 tablet, Rfl: 0   rizatriptan  (MAXALT -MLT) 10 MG disintegrating tablet, Take 1 tablet (10 mg total) by mouth as needed for migraine. May repeat in 2 hours if needed, Disp: 9 tablet, Rfl: 11   sertraline  (ZOLOFT ) 100 MG tablet, Take 1 tablet (100 mg total) by mouth daily., Disp: 60 tablet, Rfl: 0  "

## 2024-11-16 NOTE — Patient Instructions (Signed)
" °  Nancee Sorrel, thank you for joining Delon CHRISTELLA Dickinson, PA-C for today's virtual visit.  While this provider is not your primary care provider (PCP), if your PCP is located in our provider database this encounter information will be shared with them immediately following your visit.   A [facility] MyChart account gives you access to today's visit and all your visits, tests, and labs performed at [facility]  click here if you don't have a [facility] MyChart account or go to [email or web address]  Consent: (Patient) Nancee Sorrel provided verbal consent for this virtual visit at the beginning of the encounter.  Current Medications:  Current Outpatient Medications:    HYDROcodone  bit-homatropine (HYCODAN) 5-1.5 MG/5ML syrup, Take 5 mLs by mouth every 6 (six) hours as needed for cough., Disp: 120 mL, Rfl: 0   hydrOXYzine  (VISTARIL ) 25 MG capsule, Take 1 capsule (25 mg total) by mouth at bedtime and may repeat dose one time if needed. (Patient not taking: Reported on 07/14/2024), Disp: 30 capsule, Rfl: 0   medroxyPROGESTERone  (PROVERA ) 10 MG tablet, Take 1 tablet (10 mg total) by mouth daily., Disp: 90 tablet, Rfl: 3   ondansetron  (ZOFRAN -ODT) 4 MG disintegrating tablet, Take 1 tablet (4 mg total) by mouth every 8 (eight) hours as needed for nausea or vomiting., Disp: 20 tablet, Rfl: 0   rizatriptan  (MAXALT -MLT) 10 MG disintegrating tablet, Take 1 tablet (10 mg total) by mouth as needed for migraine. May repeat in 2 hours if needed, Disp: 9 tablet, Rfl: 11   sertraline  (ZOLOFT ) 100 MG tablet, Take 1 tablet (100 mg total) by mouth daily., Disp: 60 tablet, Rfl: 0   Medications ordered in this encounter:  No orders of the defined types were placed in this encounter.    *If you need refills on other medications prior to your next appointment, please contact your pharmacy*  Follow-Up: Call back or seek an in-person evaluation if the symptoms worsen or if the condition fails to  improve as anticipated.  [facility] [phone]   If you have been instructed to have an in-person evaluation today at a local Urgent Care facility, please use the link below. It will take you to a list of all of our available [facility], including address, phone number and hours of operation. Please do not delay care.  [facility]  If you or a family member do not have a primary care provider, use the link below to schedule a visit and establish care. When you choose a [facility] primary care physician or advanced practice provider, you gain a long-term partner in health. Find a Primary Care Provider  Learn more about [facility]'s in-office and virtual care options: [facility] - Get Care Now "

## 2024-11-18 ENCOUNTER — Telehealth: Admitting: Emergency Medicine

## 2024-11-18 DIAGNOSIS — R058 Other specified cough: Secondary | ICD-10-CM | POA: Diagnosis not present

## 2024-11-18 MED ORDER — BENZONATATE 100 MG PO CAPS: 100.0000 mg | ORAL_CAPSULE | Freq: Two times a day (BID) | ORAL | 0 refills | Status: AC | PRN

## 2024-11-18 MED ORDER — PREDNISONE 20 MG PO TABS: 40.0000 mg | ORAL_TABLET | Freq: Every day | ORAL | 0 refills | Status: AC

## 2024-11-18 NOTE — Patient Instructions (Signed)
 " Kelsey Rojas, thank you for joining Lamar Schlossman, PA-C for today's virtual visit.  While this provider is not your primary care provider (PCP), if your PCP is located in our provider database this encounter information will be shared with them immediately following your visit.   A [facility] MyChart account gives you access to today's visit and all your visits, tests, and labs performed at [facility]  click here if you don't have a [facility] MyChart account or go to [email or web address]  Consent: (Patient) Kelsey Rojas provided verbal consent for this virtual visit at the beginning of the encounter.  Current Medications:  Current Outpatient Medications:    benzonatate (TESSALON) 100 MG capsule, Take 1 capsule (100 mg total) by mouth 2 (two) times daily as needed for cough., Disp: 20 capsule, Rfl: 0   predniSONE (DELTASONE) 20 MG tablet, Take 2 tablets (40 mg total) by mouth daily., Disp: 10 tablet, Rfl: 0   HYDROcodone  bit-homatropine (HYCODAN) 5-1.5 MG/5ML syrup, Take 5 mLs by mouth every 6 (six) hours as needed for cough., Disp: 120 mL, Rfl: 0   hydrOXYzine  (VISTARIL ) 25 MG capsule, Take 1 capsule (25 mg total) by mouth at bedtime and may repeat dose one time if needed. (Patient not taking: Reported on 07/14/2024), Disp: 30 capsule, Rfl: 0   medroxyPROGESTERone  (PROVERA ) 10 MG tablet, Take 1 tablet (10 mg total) by mouth daily., Disp: 90 tablet, Rfl: 3   ondansetron  (ZOFRAN -ODT) 4 MG disintegrating tablet, Take 1 tablet (4 mg total) by mouth every 8 (eight) hours as needed for nausea or vomiting., Disp: 20 tablet, Rfl: 0   rizatriptan  (MAXALT -MLT) 10 MG disintegrating tablet, Take 1 tablet (10 mg total) by mouth as needed for migraine. May repeat in 2 hours if needed, Disp: 9 tablet, Rfl: 11   sertraline  (ZOLOFT ) 100 MG tablet, Take 1 tablet (100 mg total) by mouth daily., Disp: 60 tablet, Rfl: 0   Medications ordered in this encounter:  Meds ordered this encounter   Medications   benzonatate (TESSALON) 100 MG capsule    Sig: Take 1 capsule (100 mg total) by mouth 2 (two) times daily as needed for cough.    Dispense:  20 capsule    Refill:  0    Supervising Provider:   BLAISE ALEENE KIDD [[id number]]   predniSONE (DELTASONE) 20 MG tablet    Sig: Take 2 tablets (40 mg total) by mouth daily.    Dispense:  10 tablet    Refill:  0    Supervising Provider:   BLAISE ALEENE KIDD [[id number]]     *If you need refills on other medications prior to your next appointment, please contact your pharmacy*  Follow-Up: Call back or seek an in-person evaluation if the symptoms worsen or if the condition fails to improve as anticipated.  [facility] [phone]  Other Instructions    If you have been instructed to have an in-person evaluation today at a local Urgent Care facility, please use the link below. It will take you to a list of all of our available [facility], including address, phone number and hours of operation. Please do not delay care.  [facility]  If you or a family member do not have a primary care provider, use the link below to schedule a visit and establish care. When you choose a [facility] primary care physician or advanced practice provider, you gain a long-term partner in health. Find a Primary Care Provider  Learn more about [facility] in-office and virtual care options: [facility] - Get Care Now  "

## 2024-11-18 NOTE — Progress Notes (Signed)
 " Virtual Visit Consent   Kelsey Rojas, you are scheduled for a virtual visit with a  provider today. Just as with appointments in the office, your consent must be obtained to participate. Your consent will be active for this visit and any virtual visit you may have with one of our providers in the next 365 days. If you have a MyChart account, a copy of this consent can be sent to you electronically.  As this is a virtual visit, video technology does not allow for your provider to perform a traditional examination. This may limit your provider's ability to fully assess your condition. If your provider identifies any concerns that need to be evaluated in person or the need to arrange testing (such as labs, EKG, etc.), we will make arrangements to do so. Although advances in technology are sophisticated, we cannot ensure that it will always work on either your end or our end. If the connection with a video visit is poor, the visit may have to be switched to a telephone visit. With either a video or telephone visit, we are not always able to ensure that we have a secure connection.  By engaging in this virtual visit, you consent to the provision of healthcare and authorize for your insurance to be billed (if applicable) for the services provided during this visit. Depending on your insurance coverage, you may receive a charge related to this service.  I need to obtain your verbal consent now. Are you willing to proceed with your visit today? Kelsey Rojas has provided verbal consent on 11/18/2024 for a virtual visit (video or telephone). Kelsey Schlossman, PA-C  Date: 11/18/2024 10:46 AM   Virtual Visit via Video Note   I, Kelsey Rojas, connected with  Kelsey Rojas  ([id number], 31-08-1996) on 11/18/2024 at 10:45 AM EST by a video-enabled telemedicine application and verified that I am speaking with the correct person using two identifiers.  Location: Patient: Virtual Visit Location Patient:  Home Provider: Virtual Visit Location Provider: Home Office   I discussed the limitations of evaluation and management by telemedicine and the availability of in person appointments. The patient expressed understanding and agreed to proceed.    History of Present Illness: Kelsey Rojas is a 29 y.o. who identifies as a female who was assigned female at birth, and is being seen today for cough and congestion.  States that she has been having symptoms for the past 5 days.  Denies fever or chills.  Denies body aches.  Denies sore throat.  States that she felt like it was initially just a cold, but now she can't get rid of the cold.  Denies asthma or COPD.  Doesn't smoke.  Denies DM.  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  Resting comfortably at home.  Head is normocephalic, atraumatic.  No labored breathing.  Speech is clear and coherent with logical content.  Patient is alert and oriented at baseline.    Assessment and Plan: 1. Post-viral cough syndrome (Primary)  Meds ordered this encounter  Medications   benzonatate (TESSALON) 100 MG capsule    Sig: Take 1 capsule (100 mg total) by mouth 2 (two) times daily as needed for cough.  Dispense:  20 capsule    Refill:  0    Supervising Provider:   BLAISE ALEENE KIDD [[id number]]   predniSONE (DELTASONE) 20 MG tablet    Sig: Take 2 tablets (40 mg total) by mouth daily.    Dispense:  10 tablet    Refill:  0    Supervising Provider:   BLAISE ALEENE KIDD [[id number]]     Follow Up Instructions: I discussed the assessment and treatment plan with the patient. The patient was provided  an opportunity to ask questions and all were answered. The patient agreed with the plan and demonstrated an understanding of the instructions.  A copy of instructions were sent to the patient via MyChart unless otherwise noted below.     The patient was advised to call back or seek an in-person evaluation if the symptoms worsen or if the condition fails to improve as anticipated.    Kelsey Schlossman, PA-C     [1]  Allergies Allergen Reactions   Penicillins Rash and Hives    Has patient had a PCN reaction causing immediate rash, facial/tongue/throat swelling, SOB or lightheadedness with hypotension: No Has patient had a PCN reaction causing severe rash involving mucus membranes or skin necrosis: No Has patient had a PCN reaction that required hospitalization: No Has patient had a PCN reaction occurring within the last 10 years: #  #  #  YES  #  #  #  If all of the above answers are NO, then may proceed with Cephalosporin use.  Has patient had a PCN reaction causing immediate rash, facial/tongue/throat swelling, SOB or lightheadedness with hypotension: No Has patient had a PCN reaction causing severe rash involving mucus membranes or skin necrosis: No Has patient had a PCN reaction that required hospitalization: No Has patient had a PCN reaction occurring within the last 10 years: #  #  #  YES  #  #  #  If all of the above answers are NO, then may proceed with Cephalosporin use.   Ciprofloxacin Hives    Family history of a reaction UNSPECIFIED FAMILY REACTION Family history of a reaction UNSPECIFIED FAMILY REACTION   Amoxicillin  Rash   Qulipta [Atogepant] Hives and Rash  [2]  Current Outpatient Medications:    benzonatate (TESSALON) 100 MG capsule, Take 1 capsule (100 mg total) by mouth 2 (two) times daily as needed for cough., Disp: 20 capsule, Rfl: 0   predniSONE (DELTASONE) 20 MG tablet, Take 2 tablets (40 mg total) by mouth daily., Disp: 10 tablet, Rfl: 0   HYDROcodone   bit-homatropine (HYCODAN) 5-1.5 MG/5ML syrup, Take 5 mLs by mouth every 6 (six) hours as needed for cough., Disp: 120 mL, Rfl: 0   hydrOXYzine  (VISTARIL ) 25 MG capsule, Take 1 capsule (25 mg total) by mouth at bedtime and may repeat dose one time if needed. (Patient not taking: Reported on 07/14/2024), Disp: 30 capsule, Rfl: 0   medroxyPROGESTERone  (PROVERA ) 10 MG tablet, Take 1 tablet (10 mg total) by mouth daily., Disp: 90 tablet, Rfl: 3   ondansetron  (ZOFRAN -ODT) 4 MG disintegrating tablet, Take 1 tablet (4 mg total) by mouth every 8 (eight) hours as needed for nausea or vomiting., Disp: 20 tablet, Rfl: 0   rizatriptan  (MAXALT -MLT) 10 MG disintegrating tablet, Take 1 tablet (10 mg total) by mouth as needed for migraine. May repeat in 2 hours if needed, Disp: 9 tablet, Rfl: 11   sertraline  (ZOLOFT ) 100 MG tablet, Take 1 tablet (100 mg total) by mouth daily., Disp: 60  tablet, Rfl: 0  "

## 2024-11-22 ENCOUNTER — Ambulatory Visit (HOSPITAL_COMMUNITY): Admitting: Psychiatry

## 2024-12-20 ENCOUNTER — Encounter (HOSPITAL_COMMUNITY): Payer: Self-pay | Admitting: Psychiatry

## 2024-12-20 ENCOUNTER — Ambulatory Visit (HOSPITAL_COMMUNITY): Admitting: Psychiatry

## 2024-12-20 ENCOUNTER — Telehealth (HOSPITAL_COMMUNITY): Admitting: Psychiatry

## 2024-12-20 DIAGNOSIS — F4310 Post-traumatic stress disorder, unspecified: Secondary | ICD-10-CM | POA: Diagnosis not present

## 2024-12-20 DIAGNOSIS — F3341 Major depressive disorder, recurrent, in partial remission: Secondary | ICD-10-CM

## 2024-12-20 DIAGNOSIS — F411 Generalized anxiety disorder: Secondary | ICD-10-CM | POA: Diagnosis not present

## 2024-12-20 MED ORDER — AMITRIPTYLINE HCL 25 MG PO TABS: 25.0000 mg | ORAL_TABLET | Freq: Every day | ORAL | 2 refills | Status: AC

## 2024-12-20 MED ORDER — SERTRALINE HCL 100 MG PO TABS: 100.0000 mg | ORAL_TABLET | Freq: Every day | ORAL | 0 refills | Status: AC

## 2025-01-25 ENCOUNTER — Ambulatory Visit (HOSPITAL_COMMUNITY): Admitting: Licensed Clinical Social Worker

## 2025-01-26 ENCOUNTER — Ambulatory Visit (HOSPITAL_COMMUNITY): Admitting: Licensed Clinical Social Worker

## 2025-02-09 ENCOUNTER — Telehealth (HOSPITAL_COMMUNITY): Admitting: Psychiatry
